# Patient Record
Sex: Male | Born: 1955 | Race: White | Hispanic: No | Marital: Married | Smoking: Never smoker
Health system: Southern US, Community
[De-identification: ages and names within clinical notes are randomized; demographics above are authoritative.]

## PROBLEM LIST (undated history)

## (undated) DIAGNOSIS — M1990 Unspecified osteoarthritis, unspecified site: Secondary | ICD-10-CM

## (undated) DIAGNOSIS — I872 Venous insufficiency (chronic) (peripheral): Secondary | ICD-10-CM

## (undated) DIAGNOSIS — E785 Hyperlipidemia, unspecified: Secondary | ICD-10-CM

## (undated) DIAGNOSIS — I77810 Thoracic aortic ectasia: Secondary | ICD-10-CM

## (undated) DIAGNOSIS — T7840XA Allergy, unspecified, initial encounter: Secondary | ICD-10-CM

## (undated) HISTORY — PX: TONSILLECTOMY: SHX5217

## (undated) HISTORY — DX: Unspecified osteoarthritis, unspecified site: M19.90

## (undated) HISTORY — DX: Thoracic aortic ectasia: I77.810

## (undated) HISTORY — DX: Hyperlipidemia, unspecified: E78.5

## (undated) HISTORY — DX: Allergy, unspecified, initial encounter: T78.40XA

## (undated) HISTORY — PX: TONSILLECTOMY: SUR1361

## (undated) HISTORY — DX: Venous insufficiency (chronic) (peripheral): I87.2

---

## 2010-08-04 ENCOUNTER — Ambulatory Visit: Payer: Self-pay | Admitting: Gastroenterology

## 2010-08-04 LAB — HM COLONOSCOPY

## 2011-07-20 LAB — PSA

## 2012-02-07 ENCOUNTER — Ambulatory Visit: Payer: Self-pay | Admitting: Family Medicine

## 2012-02-16 ENCOUNTER — Ambulatory Visit: Payer: Self-pay | Admitting: Internal Medicine

## 2012-05-14 ENCOUNTER — Ambulatory Visit: Payer: Self-pay | Admitting: Internal Medicine

## 2012-08-16 ENCOUNTER — Ambulatory Visit: Payer: Self-pay | Admitting: Internal Medicine

## 2012-12-24 ENCOUNTER — Ambulatory Visit: Payer: Self-pay

## 2014-05-29 ENCOUNTER — Ambulatory Visit: Payer: Self-pay | Admitting: Emergency Medicine

## 2014-07-11 ENCOUNTER — Ambulatory Visit: Payer: Self-pay

## 2015-02-27 ENCOUNTER — Ambulatory Visit
Admit: 2015-02-27 | Disposition: A | Discharge status: Home / Self Care | Payer: Self-pay | Attending: Unknown Physician Specialty | Admitting: Unknown Physician Specialty

## 2015-06-03 ENCOUNTER — Ambulatory Visit
Admission: EM | Admit: 2015-06-03 | Discharge: 2015-06-03 | Disposition: A | Discharge status: Home / Self Care | Payer: No Typology Code available for payment source | Attending: Internal Medicine | Admitting: Internal Medicine

## 2015-06-03 ENCOUNTER — Encounter: Payer: Self-pay | Admitting: Emergency Medicine

## 2015-06-03 DIAGNOSIS — J018 Other acute sinusitis: Secondary | ICD-10-CM

## 2015-06-03 MED ORDER — DOXYCYCLINE HYCLATE 100 MG PO CAPS: 100.0000 mg | ORAL_CAPSULE | Freq: Two times a day (BID) | ORAL | Status: DC

## 2015-06-03 MED ORDER — PREDNISONE 50 MG PO TABS: 50.0000 mg | ORAL_TABLET | Freq: Every day | ORAL | Status: DC

## 2015-06-03 NOTE — ED Notes (Signed)
Patient c/o sinus pain and pressure, nasal congestion, and HAs for the past 3 days.  Patient denies fevers.

## 2015-06-03 NOTE — ED Provider Notes (Signed)
CSN: [id number]     Arrival date & time 06/03/15  0759 History   First MD Initiated Contact with Patient 06/03/15 0901     Chief Complaint  Patient presents with  . Facial Pain  . Nasal Congestion   HPI Patient is a 59 year old gentleman with minimal past medical history. He presents today with a several days history of head congestion, runny nose, cough that is occasionally productive. Tactile temps. Low bit of achiness. He denies nausea, vomiting, diarrhea. He is taking Mucinex over-the-counter, but not improving.  History reviewed. No pertinent past medical history. Past Surgical History  Procedure Laterality Date  . Tonsillectomy     History reviewed. No pertinent family history. History  Substance Use Topics  . Smoking status: Never Smoker   . Smokeless tobacco: Never Used  . Alcohol Use: No    Review of Systems  All other systems reviewed and are negative.   Allergies  Review of patient's allergies indicates no known allergies.  Home Medications    BP 112/86 mmHg  Pulse 79  Temp(Src) 97.4 F (36.3 C) (Tympanic)  Resp 16  Ht 5\' 6"  (1.676 m)  Wt 160 lb (72.576 kg)  BMI 25.84 kg/m2  SpO2 98% Physical Exam  Constitutional: He is oriented to person, place, and time. No distress.  Alert, nicely groomed Voice sounds quite congested  HENT:  Head: Atraumatic.  Bilateral TMs are mildly dull, no erythema Moderate nasal congestion, with mucopurulent material present Throat is red with postnasal drainage evident.  Eyes:  Conjugate gaze, no eye redness/drainage  Neck: Neck supple.  Cardiovascular: Normal rate and regular rhythm.   Pulmonary/Chest: No respiratory distress. He has no wheezes. He has no rales.  Lungs clear, symmetric breath sounds  Abdominal: Soft. He exhibits no distension.  Musculoskeletal: Normal range of motion.  Neurological: He is alert and oriented to person, place, and time.  Skin: Skin is warm and dry.  No cyanosis  Nursing note and vitals  reviewed.   ED Course  Procedures  No procedures at the urgent care today  MDM   1. Other acute sinusitis    Prescription for prednisone burst, with doxycycline to start in a few days if symptoms are continuing. Follow-up with PCP, Gabriel Cirriheryl Wicker NP for persistent or worsening symptoms.    Eustace MooreLaura W Mihira Tozzi, MD 06/03/15 [phone]

## 2015-06-03 NOTE — Discharge Instructions (Signed)
Prescriptions for prednisone and doxycycline were sent to the pharmacy.  Start the prednisone first, to see if decongestion improves your symptoms enough first, then if not can start the doxycycline if not improving after a week of symptoms. Recheck with pcp/Cheryl Wicker NP for persistent/worsening symptoms. Anticipate slow improvement over the next couple weeks; cough usually takes the longest to resolve.  Upper Respiratory Infection, Adult An upper respiratory infection (URI) is also sometimes known as the common cold. The upper respiratory tract includes the nose, sinuses, throat, trachea, and bronchi. Bronchi are the airways leading to the lungs. Most people improve within 1 week, but symptoms can last up to 2 weeks. A residual cough may last even longer.  CAUSES Many different viruses can infect the tissues lining the upper respiratory tract. The tissues become irritated and inflamed and often become very moist. Mucus production is also common. A cold is contagious. You can easily spread the virus to others by oral contact. This includes kissing, sharing a glass, coughing, or sneezing. Touching your mouth or nose and then touching a surface, which is then touched by another person, can also spread the virus. SYMPTOMS  Symptoms typically develop 1 to 3 days after you come in contact with a cold virus. Symptoms vary from person to person. They may include:  Runny nose.  Sneezing.  Nasal congestion.  Sinus irritation.  Sore throat.  Loss of voice (laryngitis).  Cough.  Fatigue.  Muscle aches.  Loss of appetite.  Headache.  Low-grade fever. DIAGNOSIS  You might diagnose your own cold based on familiar symptoms, since most people get a cold 2 to 3 times a year. Your caregiver can confirm this based on your exam. Most importantly, your caregiver can check that your symptoms are not due to another disease such as strep throat, sinusitis, pneumonia, asthma, or epiglottitis. Blood  tests, throat tests, and X-rays are not necessary to diagnose a common cold, but they may sometimes be helpful in excluding other more serious diseases. Your caregiver will decide if any further tests are required. RISKS AND COMPLICATIONS  You may be at risk for a more severe case of the common cold if you smoke cigarettes, have chronic heart disease (such as heart failure) or lung disease (such as asthma), or if you have a weakened immune system. The very young and very old are also at risk for more serious infections. Bacterial sinusitis, middle ear infections, and bacterial pneumonia can complicate the common cold. The common cold can worsen asthma and chronic obstructive pulmonary disease (COPD). Sometimes, these complications can require emergency medical care and may be life-threatening. PREVENTION  The best way to protect against getting a cold is to practice good hygiene. Avoid oral or hand contact with people with cold symptoms. Wash your hands often if contact occurs. There is no clear evidence that vitamin C, vitamin E, echinacea, or exercise reduces the chance of developing a cold. However, it is always recommended to get plenty of rest and practice good nutrition. TREATMENT  Treatment is directed at relieving symptoms. There is no cure. Antibiotics are not effective, because the infection is caused by a virus, not by bacteria. Treatment may include:  Increased fluid intake. Sports drinks offer valuable electrolytes, sugars, and fluids.  Breathing heated mist or steam (vaporizer or shower).  Eating chicken soup or other clear broths, and maintaining good nutrition.  Getting plenty of rest.  Using gargles or lozenges for comfort.  Controlling fevers with ibuprofen or acetaminophen as directed by  your caregiver.  Increasing usage of your inhaler if you have asthma. Zinc gel and zinc lozenges, taken in the first 24 hours of the common cold, can shorten the duration and lessen the  severity of symptoms. Pain medicines may help with fever, muscle aches, and throat pain. A variety of non-prescription medicines are available to treat congestion and runny nose. Your caregiver can make recommendations and may suggest nasal or lung inhalers for other symptoms.  HOME CARE INSTRUCTIONS   Only take over-the-counter or prescription medicines for pain, discomfort, or fever as directed by your caregiver.  Use a warm mist humidifier or inhale steam from a shower to increase air moisture. This may keep secretions moist and make it easier to breathe.  Drink enough water and fluids to keep your urine clear or pale yellow.  Rest as needed.  Return to work when your temperature has returned to normal or as your caregiver advises. You may need to stay home longer to avoid infecting others. You can also use a face mask and careful hand washing to prevent spread of the virus. SEEK MEDICAL CARE IF:   After the first few days, you feel you are getting worse rather than better.  You need your caregiver's advice about medicines to control symptoms.  You develop chills, worsening shortness of breath, or brown or red sputum. These may be signs of pneumonia.  You develop yellow or brown nasal discharge or pain in the face, especially when you bend forward. These may be signs of sinusitis.  You develop a fever, swollen neck glands, pain with swallowing, or white areas in the back of your throat. These may be signs of strep throat. SEEK IMMEDIATE MEDICAL CARE IF:   You have a fever.  You develop severe or persistent headache, ear pain, sinus pain, or chest pain.  You develop wheezing, a prolonged cough, cough up blood, or have a change in your usual mucus (if you have chronic lung disease).  You develop sore muscles or a stiff neck. Document Released: 05/05/2001 Document Revised: 02/01/2012 Document Reviewed: 02/14/2014 [facility] Patient Information 2015 [place], [place]. This information is  not intended to replace advice given to you by your health care provider. Make sure you discuss any questions you have with your health care provider.

## 2015-10-18 DIAGNOSIS — L4050 Arthropathic psoriasis, unspecified: Secondary | ICD-10-CM

## 2015-10-18 DIAGNOSIS — I872 Venous insufficiency (chronic) (peripheral): Secondary | ICD-10-CM

## 2015-10-18 DIAGNOSIS — E785 Hyperlipidemia, unspecified: Secondary | ICD-10-CM | POA: Insufficient documentation

## 2015-10-18 DIAGNOSIS — J45909 Unspecified asthma, uncomplicated: Secondary | ICD-10-CM | POA: Insufficient documentation

## 2015-10-18 DIAGNOSIS — J309 Allergic rhinitis, unspecified: Secondary | ICD-10-CM

## 2015-10-30 ENCOUNTER — Encounter: Payer: Self-pay | Admitting: Unknown Physician Specialty

## 2015-10-30 ENCOUNTER — Ambulatory Visit (INDEPENDENT_AMBULATORY_CARE_PROVIDER_SITE_OTHER): Payer: No Typology Code available for payment source | Admitting: Unknown Physician Specialty

## 2015-10-30 VITALS — BP 125/85 | HR 70 | Temp 98.1°F | Ht 63.3 in | Wt 161.2 lb

## 2015-10-30 DIAGNOSIS — Z23 Encounter for immunization: Secondary | ICD-10-CM | POA: Diagnosis not present

## 2015-10-30 DIAGNOSIS — Z0000 Encounter for general adult medical examination without abnormal findings: Secondary | ICD-10-CM

## 2015-10-30 NOTE — Progress Notes (Signed)
   BP 125/85 mmHg  Pulse 70  Temp(Src) 98.1 F (36.7 C)  Ht 5' 3.3" (1.608 m)  Wt 161 lb 3.2 oz (73.12 kg)  BMI 28.28 kg/m2  SpO2 97%   Subjective:    Patient ID: Benjamin Boone, male    DOB: 12/25/1955, 59 y.o.   MRN: [id number]  HPI: Benjamin Boone is a 59 y.o. male  Chief Complaint  Patient presents with  . Annual Exam    Relevant past medical, surgical, family and social history reviewed and updated as indicated. Interim medical history since our last visit reviewed. Allergies and medications reviewed and updated.  Review of Systems  Constitutional: Negative.   HENT: Negative.   Eyes: Negative.   Respiratory: Negative.   Cardiovascular: Negative.   Gastrointestinal: Negative.   Endocrine: Negative.   Genitourinary: Negative.   Skin: Negative.   Allergic/Immunologic: Negative.   Neurological: Negative.   Hematological: Negative.   Psychiatric/Behavioral: Negative.     Per HPI unless specifically indicated above     Objective:    BP 125/85 mmHg  Pulse 70  Temp(Src) 98.1 F (36.7 C)  Ht 5' 3.3" (1.608 m)  Wt 161 lb 3.2 oz (73.12 kg)  BMI 28.28 kg/m2  SpO2 97%  Wt Readings from Last 3 Encounters:  10/30/15 161 lb 3.2 oz (73.12 kg)  02/27/15 168 lb (76.204 kg)  06/03/15 160 lb (72.576 kg)    Physical Exam  Constitutional: He is oriented to person, place, and time. He appears well-developed and well-nourished.  HENT:  Head: Normocephalic.  Eyes: Pupils are equal, round, and reactive to light.  Cardiovascular: Normal rate, regular rhythm and normal heart sounds.   Pulmonary/Chest: Effort normal.  Abdominal: Soft. Bowel sounds are normal.  Musculoskeletal: Normal range of motion.  Neurological: He is alert and oriented to person, place, and time. He has normal reflexes.  Skin: Skin is warm and dry.  Psychiatric: He has a normal mood and affect. His behavior is normal. Judgment and thought content normal.    Assessment & Plan:    Problem List Items Addressed This Visit    None    Visit Diagnoses    Immunization due    -  Primary    Relevant Orders    Flu Vaccine QUAD 36+ mos IM (Completed)    Routine general medical examination at a health care facility        Relevant Orders    CBC with Differential/Platelet    Comprehensive metabolic panel    PSA    TSH    Hepatitis C antibody    HIV antibody    Lipid Panel w/o Chol/HDL Ratio        Follow up plan: Return in about 1 year (around 10/29/2016).

## 2015-10-31 LAB — CBC WITH DIFFERENTIAL/PLATELET
Basophils Absolute: 0.1 10*3/uL (ref 0.0–0.2)
Basos: 1 %
EOS (ABSOLUTE): 0.5 10*3/uL — ABNORMAL HIGH (ref 0.0–0.4)
EOS: 7 %
HEMATOCRIT: 44.8 % (ref 37.5–51.0)
HEMOGLOBIN: 15.5 g/dL (ref 12.6–17.7)
Immature Grans (Abs): 0 10*3/uL (ref 0.0–0.1)
Immature Granulocytes: 0 %
Lymphocytes Absolute: 2.3 10*3/uL (ref 0.7–3.1)
Lymphs: 32 %
MCH: 28.7 pg (ref 26.6–33.0)
MCHC: 34.6 g/dL (ref 31.5–35.7)
MCV: 83 fL (ref 79–97)
MONOCYTES: 7 %
Monocytes Absolute: 0.5 10*3/uL (ref 0.1–0.9)
Neutrophils Absolute: 3.7 10*3/uL (ref 1.4–7.0)
Neutrophils: 53 %
Platelets: 288 10*3/uL (ref 150–379)
RBC: 5.41 x10E6/uL (ref 4.14–5.80)
RDW: 13.4 % (ref 12.3–15.4)
WBC: 7 10*3/uL (ref 3.4–10.8)

## 2015-10-31 LAB — COMPREHENSIVE METABOLIC PANEL
A/G RATIO: 1.5 (ref 1.1–2.5)
ALBUMIN: 4.1 g/dL (ref 3.5–5.5)
ALK PHOS: 89 IU/L (ref 39–117)
ALT: 21 IU/L (ref 0–44)
AST: 22 IU/L (ref 0–40)
BUN / CREAT RATIO: 12 (ref 9–20)
BUN: 13 mg/dL (ref 6–24)
Bilirubin Total: 0.3 mg/dL (ref 0.0–1.2)
CO2: 24 mmol/L (ref 18–29)
CREATININE: 1.09 mg/dL (ref 0.76–1.27)
Calcium: 9.8 mg/dL (ref 8.7–10.2)
Chloride: 99 mmol/L (ref 97–106)
GFR calc Af Amer: 87 mL/min/{1.73_m2} (ref >59)
GFR calc non Af Amer: 75 mL/min/{1.73_m2} (ref >59)
GLOBULIN, TOTAL: 2.7 g/dL (ref 1.5–4.5)
Glucose: 85 mg/dL (ref 65–99)
POTASSIUM: 4.5 mmol/L (ref 3.5–5.2)
SODIUM: 137 mmol/L (ref 136–144)
Total Protein: 6.8 g/dL (ref 6.0–8.5)

## 2015-10-31 LAB — HEPATITIS C ANTIBODY: Hep C Virus Ab: <0.1 s/co ratio (ref 0.0–0.9)

## 2015-10-31 LAB — PSA: Prostate Specific Ag, Serum: 0.9 ng/mL (ref 0.0–4.0)

## 2015-10-31 LAB — TSH: TSH: 3.91 u[IU]/mL (ref 0.450–4.500)

## 2015-10-31 LAB — LIPID PANEL W/O CHOL/HDL RATIO
Cholesterol, Total: 229 mg/dL — ABNORMAL HIGH (ref 100–199)
HDL: 35 mg/dL — ABNORMAL LOW (ref >39)
LDL Calculated: 118 mg/dL — ABNORMAL HIGH (ref 0–99)
TRIGLYCERIDES: 381 mg/dL — AB (ref 0–149)
VLDL CHOLESTEROL CAL: 76 mg/dL — AB (ref 5–40)

## 2015-10-31 LAB — HIV ANTIBODY (ROUTINE TESTING W REFLEX): HIV Screen 4th Generation wRfx: NONREACTIVE

## 2015-11-01 ENCOUNTER — Encounter: Payer: Self-pay | Admitting: Unknown Physician Specialty

## 2015-12-04 ENCOUNTER — Encounter: Payer: Self-pay | Admitting: Emergency Medicine

## 2015-12-04 ENCOUNTER — Ambulatory Visit
Admission: EM | Admit: 2015-12-04 | Discharge: 2015-12-04 | Disposition: A | Discharge status: Home / Self Care | Payer: BLUE CROSS/BLUE SHIELD | Attending: Emergency Medicine | Admitting: Emergency Medicine

## 2015-12-04 DIAGNOSIS — J0100 Acute maxillary sinusitis, unspecified: Secondary | ICD-10-CM

## 2015-12-04 MED ORDER — AMOXICILLIN-POT CLAVULANATE 875-125 MG PO TABS: 1.0000 | ORAL_TABLET | Freq: Two times a day (BID) | ORAL | Status: DC

## 2015-12-04 NOTE — ED Notes (Signed)
Patient states he "has recurrent sinus symptoms which have been going on for 2 weeks"

## 2015-12-04 NOTE — Discharge Instructions (Signed)

## 2015-12-04 NOTE — ED Provider Notes (Signed)
[facility]  ____________________________________________  Time seen: Approximately 8:42 AM  I have reviewed the triage vital signs and the nursing notes.   HISTORY  Chief Complaint Recurrent Sinusitis   HPI Benjamin Boone is a 60 y.o. malepresents for complaints of 2 weeks of runny nose, nasal congestion and sinus pressure. Patient reports he is feeling getting thick greenish yellowish nasal drainage and blowing his nose. States really have and sinus pressure around his cheeks. States sinus pressure is a 3-4 out of 10 aching discomfort. Reports continues to eat and drink well. Denies fevers. Reports frequent exposed to sick contacts.    patient reports history of sinus infections at least once or twice a year. States has been following with ENT in the past. Denies recent antibiotics. Denies chest pain, shortness of breath, abdominal pain, fevers, weakness, dizziness, vision changes or other complaints.   PCP: Jamesetta Orleans  Past Medical History  Diagnosis Date  . Arthritis   . Allergy   . Hyperlipidemia   . Venous insufficiency     Patient Active Problem List   Diagnosis Date Noted  . Psoriatic arthritis (HCC) 10/18/2015  . Allergic rhinitis 10/18/2015  . Hyperlipidemia 10/18/2015  . Venous insufficiency 10/18/2015  . Asthma 10/18/2015    Past Surgical History  Procedure Laterality Date  . Tonsillectomy    . Tonsillectomy      Current Outpatient Rx  Name  Route  Sig  Dispense  Refill  . aspirin 81 MG tablet   Oral   Take 81 mg by mouth daily.         .           . fluticasone (FLONASE) 50 MCG/ACT nasal spray   Each Nare   Place 2 sprays into both nostrils daily.           Allergies Doxycycline  Family History  Problem Relation Age of Onset  . Heart disease Father     MI  . Heart disease Mother     pacemaker    Social History Social History  Substance Use Topics  . Smoking status: Never Smoker   . Smokeless tobacco: Never Used  .  Alcohol Use: No    Review of Systems Constitutional: No fever/chills Eyes: No visual changes. ENT: No sore throat. Positive runny nose, nasal congestion, sinus pressure and sinus drainage. States occasional cough at night.  Cardiovascular: Denies chest pain. Respiratory: Denies shortness of breath. Gastrointestinal: No abdominal pain.  No nausea, no vomiting.  No diarrhea.  No constipation. Genitourinary: Negative for dysuria. Musculoskeletal: Negative for back pain. Skin: Negative for rash. Neurological: Negative for headaches, focal weakness or numbness.  10-point ROS otherwise negative.  ____________________________________________   PHYSICAL EXAM:  VITAL SIGNS: ED Triage Vitals  Enc Vitals Group     BP 12/04/15 0829 100/72 mmHg     Pulse Rate 12/04/15 0829 73     Resp 12/04/15 0829 16     Temp 12/04/15 0829 97.5 F (36.4 C)     Temp Source 12/04/15 0829 Oral     SpO2 12/04/15 0829 98 %     Weight 12/04/15 0829 162 lb (73.483 kg)     Height 12/04/15 0829 5\' 5"  (1.651 m)     Head Cir --      Peak Flow --      Pain Score 12/04/15 0829 3     Pain Loc --      Pain Edu? --      Excl. in GC? --  Constitutional: Alert and oriented. Well appearing and in no acute distress. Eyes: Conjunctivae are normal. PERRL. EOMI. Head: [email or web address] to mod TTP bilateral  maxillary sinuses, mild tenderness palpation bilateral frontal sinuses. No swelling. No erythema.   Ears: no erythema, normal TMs bilaterally.   NoseNasal congestion with bilateral nasal turbinate erythema and edema. Mouth/Throat: Mucous membranes are moist.  Oropharynx non-erythematous. no tonsillar swelling or exudate.  Neck: No stridor.  No cervical spine tenderness to palpation. Hematological/Lymphatic/Immunilogical: No cervical lymphadenopathy. Cardiovascular: Normal rate, regular rhythm. Grossly normal heart sounds.  Good peripheral circulation. Respiratory: Normal respiratory effort.  No retractions. Lungs  CTAB. Gastrointestinal: Soft and nontender. Musculoskeletal: No lower or upper extremity tenderness nor edema.   Neurologic:  Normal speech and language. No gross focal neurologic deficits are appreciated. No gait instability. Skin:  Skin is warm, dry and intact. No rash noted. Psychiatric: Mood and affect are normal. Speech and behavior are normal.  ____________________________________________   LABS (all labs ordered are listed, but only abnormal results are displayed)  Labs Reviewed - No data to display ____________________________________________   INITIAL IMPRESSION / ASSESSMENT AND PLAN / ED COURSE  Pertinent labs & imaging results that were available during my care of the patient were reviewed by me and considered in my medical decision making (see chart for details).   well-appearing patient. No acute distress. Presents for the complaints of 2 weeks of runny nose, nasal congestion, sinus pressure and sinus drainage. Reports this has been unrelieved with multiple over-the-counter cough and congestion medications. Lungs clear throughout. Well-appearing patient. Will treat sinusitis with oral Augmentin and encouraged rest, saline sinus rinses, fluids and PCP follow-up.  Discussed follow up with Primary care physician this week. Discussed follow up and return parameters including no resolution or any worsening concerns. Patient verbalized understanding and agreed to plan.   ____________________________________________   FINAL CLINICAL IMPRESSION(S) / ED DIAGNOSES  Final diagnoses:  Acute maxillary sinusitis, recurrence not specified       Renford DillsLindsey Ankush Gintz, NP 12/04/15 0848

## 2016-04-02 ENCOUNTER — Other Ambulatory Visit: Payer: Self-pay

## 2016-04-03 ENCOUNTER — Encounter: Payer: Self-pay | Admitting: Unknown Physician Specialty

## 2016-04-03 ENCOUNTER — Ambulatory Visit (INDEPENDENT_AMBULATORY_CARE_PROVIDER_SITE_OTHER): Payer: BLUE CROSS/BLUE SHIELD | Admitting: Unknown Physician Specialty

## 2016-04-03 VITALS — BP 125/91 | HR 80 | Temp 97.8°F | Ht 64.7 in | Wt 164.8 lb

## 2016-04-03 DIAGNOSIS — J019 Acute sinusitis, unspecified: Secondary | ICD-10-CM | POA: Diagnosis not present

## 2016-04-03 MED ORDER — AMOXICILLIN-POT CLAVULANATE 875-125 MG PO TABS: 1.0000 | ORAL_TABLET | Freq: Two times a day (BID) | ORAL | Status: DC

## 2016-04-03 NOTE — Patient Instructions (Signed)
Sinusitis, Adult °Sinusitis is redness, soreness, and inflammation of the paranasal sinuses. Paranasal sinuses are air pockets within the bones of your face. They are located beneath your eyes, in the middle of your forehead, and above your eyes. In healthy paranasal sinuses, mucus is able to drain out, and air is able to circulate through them by way of your nose. However, when your paranasal sinuses are inflamed, mucus and air can become trapped. This can allow bacteria and other germs to grow and cause infection. °Sinusitis can develop quickly and last only a short time (acute) or continue over a long period (chronic). Sinusitis that lasts for more than 12 weeks is considered chronic. °CAUSES °Causes of sinusitis include: °· Allergies. °· Structural abnormalities, such as displacement of the cartilage that separates your nostrils (deviated septum), which can decrease the air flow through your nose and sinuses and affect sinus drainage. °· Functional abnormalities, such as when the small hairs (cilia) that line your sinuses and help remove mucus do not work properly or are not present. °SIGNS AND SYMPTOMS °Symptoms of acute and chronic sinusitis are the same. The primary symptoms are pain and pressure around the affected sinuses. Other symptoms include: °· Upper toothache. °· Earache. °· Headache. °· Bad breath. °· Decreased sense of smell and taste. °· A cough, which worsens when you are lying flat. °· Fatigue. °· Fever. °· Thick drainage from your nose, which often is green and may contain pus (purulent). °· Swelling and warmth over the affected sinuses. °DIAGNOSIS °Your health care provider will perform a physical exam. During your exam, your health care provider may perform any of the following to help determine if you have acute sinusitis or chronic sinusitis: °· Look in your nose for signs of abnormal growths in your nostrils (nasal polyps). °· Tap over the affected sinus to check for signs of  infection. °· View the inside of your sinuses using an imaging device that has a light attached (endoscope). °If your health care provider suspects that you have chronic sinusitis, one or more of the following tests may be recommended: °· Allergy tests. °· Nasal culture. A sample of mucus is taken from your nose, sent to a lab, and screened for bacteria. °· Nasal cytology. A sample of mucus is taken from your nose and examined by your health care provider to determine if your sinusitis is related to an allergy. °TREATMENT °Most cases of acute sinusitis are related to a viral infection and will resolve on their own within 10 days. Sometimes, medicines are prescribed to help relieve symptoms of both acute and chronic sinusitis. These may include pain medicines, decongestants, nasal steroid sprays, or saline sprays. °However, for sinusitis related to a bacterial infection, your health care provider will prescribe antibiotic medicines. These are medicines that will help kill the bacteria causing the infection. °Rarely, sinusitis is caused by a fungal infection. In these cases, your health care provider will prescribe antifungal medicine. °For some cases of chronic sinusitis, surgery is needed. Generally, these are cases in which sinusitis recurs more than 3 times per year, despite other treatments. °HOME CARE INSTRUCTIONS °· Drink plenty of water. Water helps thin the mucus so your sinuses can drain more easily. °· Use a humidifier. °· Inhale steam 3-4 times a day (for example, sit in the bathroom with the shower running). °· Apply a warm, moist washcloth to your face 3-4 times a day, or as directed by your health care provider. °· Use saline nasal sprays to help   moisten and clean your sinuses. °· Take medicines only as directed by your health care provider. °· If you were prescribed either an antibiotic or antifungal medicine, finish it all even if you start to feel better. °SEEK IMMEDIATE MEDICAL CARE IF: °· You have  increasing pain or severe headaches. °· You have nausea, vomiting, or drowsiness. °· You have swelling around your face. °· You have vision problems. °· You have a stiff neck. °· You have difficulty breathing. °  °This information is not intended to replace advice given to you by your health care provider. Make sure you discuss any questions you have with your health care provider. °  °Document Released: 11/09/2005 Document Revised: 11/30/2014 Document Reviewed: 11/24/2011 °Elsevier Interactive Patient Education ©2016 [employer] ° °Sinus Rinse °WHAT IS A SINUS RINSE? °A sinus rinse is a home treatment. It rinses your sinuses with a mixture of salt and water (saline solution). Sinuses are air-filled spaces in your skull behind the bones of your face and forehead. They open into your nasal cavity. °To do a sinus rinse, you will need: °· Saline solution. °· Neti pot or spray bottle. This releases the saline solution into your nose and through your sinuses. You can buy neti pots and spray bottles at: °¨ Your local pharmacy. °¨ A health food store. °¨ Online. °WHEN WOULD I DO A SINUS RINSE?  °A sinus rinse can help to clear your nasal cavity. It can clear:  °· Mucus. °· Dirt. °· Dust. °· Pollen. °You may do a sinus rinse when you have: °· A cold. °· A virus. °· Allergies. °· A sinus infection. °· A stuffy nose. °If you are considering a sinus rinse: °· Ask your child's doctor before doing a sinus rinse on your child. °· Do not do a sinus rinse if you have had: °¨ Ear or nasal surgery. °¨ An ear infection. °¨ Blocked ears. °HOW DO I DO A SINUS RINSE?  °· Wash your hands. °· Disinfect your device using the directions that came with the device. °· Dry your device. °· Use the solution that comes with your device or one that is sold separately in stores. Follow the mixing directions on the package. °· Fill your device with the amount of saline solution as stated in the device instructions. °· Stand over a sink and tilt  your head sideways over the sink. °· Place the spout of the device in your upper nostril (the one closer to the ceiling). °· Gently pour or squeeze the saline solution into the nasal cavity. The liquid should drain to the lower nostril if you are not too congested. °· Gently blow your nose. Blowing too hard may cause ear pain. °· Repeat in the other nostril. °· Clean and rinse your device with clean water. °· Air-dry your device. °ARE THERE RISKS OF A SINUS RINSE?  °Sinus rinse is normally very safe and helpful. However, there are a few risks, which include:  °· A burning feeling in the sinuses. This may happen if you do not make the saline solution as instructed. Make sure to follow all directions when making the saline solution. °· Infection from unclean water. This is rare, but possible. °· Nasal irritation. °  °This information is not intended to replace advice given to you by your health care provider. Make sure you discuss any questions you have with your health care provider. °  °Document Released: 06/06/2014 Document Reviewed: 06/06/2014 °Elsevier Interactive Patient Education ©2016 [employer] ° °

## 2016-04-03 NOTE — Progress Notes (Signed)
---------------------------------------------------------------------------------------------------------------------------------------------------------------------------------------------------------------------------------------------------  BP 125/91 mmHg  Pulse 80  Temp(Src) 97.8 F (36.6 C)  Ht 5' 4.7" (1.643 m)  Wt 164 lb 12.8 oz (74.753 kg)  BMI 27.69 kg/m2  SpO2 96%   Subjective:    Patient ID: Benjamin Boone, male    DOB: January 10, 1956, 60 y.o.   MRN: [id number]  HPI: Benjamin Boone is a 60 y.o. male  Chief Complaint  Patient presents with  . Sinusitis    pt states he has had some draingage and congestion for a while now   Sinusitis This is a new problem. Episode onset: 3-4 weeks. The problem has been gradually worsening since onset. There has been no fever. Associated symptoms include congestion, coughing, headaches, a hoarse voice and sinus pressure. Pertinent negatives include no shortness of breath or sore throat. Past treatments include nothing.     Relevant past medical, surgical, family and social history reviewed and updated as indicated. Interim medical history since our last visit reviewed. Allergies and medications reviewed and updated.  Review of Systems  HENT: Positive for congestion, hoarse voice and sinus pressure. Negative for sore throat.   Respiratory: Positive for cough. Negative for shortness of breath.   Neurological: Positive for headaches.    Per HPI unless specifically indicated above     Objective:    BP 125/91 mmHg  Pulse 80  Temp(Src) 97.8 F (36.6 C)  Ht 5' 4.7" (1.643 m)  Wt 164 lb 12.8 oz (74.753 kg)  BMI 27.69 kg/m2  SpO2 96%  Wt Readings from Last 3 Encounters:  04/03/16 164 lb 12.8 oz (74.753 kg)  12/04/15 162 lb (73.483 kg)  10/30/15 161 lb 3.2 oz (73.12 kg)    Physical Exam  Constitutional: He is oriented to person, place, and time. He appears well-developed and well-nourished. No distress.  HENT:   Head: Normocephalic and atraumatic.  Right Ear: Tympanic membrane and ear canal normal.  Left Ear: Tympanic membrane and ear canal normal.  Nose: Mucosal edema and sinus tenderness present. Right sinus exhibits maxillary sinus tenderness. Left sinus exhibits maxillary sinus tenderness.  Mouth/Throat: Mucous membranes are normal. Oropharyngeal exudate and posterior oropharyngeal edema present.  Eyes: Conjunctivae and lids are normal. Right eye exhibits no discharge. Left eye exhibits no discharge. No scleral icterus.  Cardiovascular: Normal rate and regular rhythm.   Pulmonary/Chest: Effort normal. No respiratory distress.  Abdominal: Normal appearance and bowel sounds are normal. He exhibits no distension. There is no splenomegaly or hepatomegaly. There is no tenderness.  Musculoskeletal: Normal range of motion.  Neurological: He is alert and oriented to person, place, and time.  Skin: Skin is intact. No rash noted. No pallor.  Psychiatric: He has a normal mood and affect. His behavior is normal. Judgment and thought content normal.    Results for orders placed or performed in visit on 10/30/15  CBC with Differential/Platelet  Result Value Ref Range   WBC 7.0 3.4 - 10.8 x10E3/uL   RBC 5.41 4.14 - 5.80 x10E6/uL   Hemoglobin 15.5 12.6 - 17.7 g/dL   Hematocrit 40.9 81.1 - 51.0 %   MCV 83 79 - 97 fL   MCH 28.7 26.6 - 33.0 pg   MCHC 34.6 31.5 - 35.7 g/dL   RDW 91.4 78.2 - 95.6 %   Platelets 288 150 - 379 x10E3/uL   Neutrophils 53 %   Lymphs 32 %   Monocytes 7 %   Eos 7 %   Basos 1 %   Neutrophils Absolute 3.7 1.4 -  7.0 x10E3/uL   Lymphocytes Absolute 2.3 0.7 - 3.1 x10E3/uL   Monocytes Absolute 0.5 0.1 - 0.9 x10E3/uL   EOS (ABSOLUTE) 0.5 (H) 0.0 - 0.4 x10E3/uL   Basophils Absolute 0.1 0.0 - 0.2 x10E3/uL   Immature Granulocytes 0 %   Immature Grans (Abs) 0.0 0.0 - 0.1 x10E3/uL  Comprehensive metabolic panel  Result Value Ref Range   Glucose 85 65 - 99 mg/dL   BUN 13 6 - 24 mg/dL    Creatinine, Ser 1.611.09 0.76 - 1.27 mg/dL   GFR calc non Af Amer 75 >59 mL/min/1.73   GFR calc Af Amer 87 >59 mL/min/1.73   BUN/Creatinine Ratio 12 9 - 20   Sodium 137 136 - 144 mmol/L   Potassium 4.5 3.5 - 5.2 mmol/L   Chloride 99 97 - 106 mmol/L   CO2 24 18 - 29 mmol/L   Calcium 9.8 8.7 - 10.2 mg/dL   Total Protein 6.8 6.0 - 8.5 g/dL   Albumin 4.1 3.5 - 5.5 g/dL   Globulin, Total 2.7 1.5 - 4.5 g/dL   Albumin/Globulin Ratio 1.5 1.1 - 2.5   Bilirubin Total 0.3 0.0 - 1.2 mg/dL   Alkaline Phosphatase 89 39 - 117 IU/L   AST 22 0 - 40 IU/L   ALT 21 0 - 44 IU/L  PSA  Result Value Ref Range   Prostate Specific Ag, Serum 0.9 0.0 - 4.0 ng/mL  TSH  Result Value Ref Range   TSH 3.910 0.450 - 4.500 uIU/mL  Hepatitis C antibody  Result Value Ref Range   Hep C Virus Ab <0.1 0.0 - 0.9 s/co ratio  HIV antibody  Result Value Ref Range   HIV Screen 4th Generation wRfx Non Reactive Non Reactive  Lipid Panel w/o Chol/HDL Ratio  Result Value Ref Range   Cholesterol, Total 229 (H) 100 - 199 mg/dL   Triglycerides 096381 (H) 0 - 149 mg/dL   HDL 35 (L) >04>39 mg/dL   VLDL Cholesterol Cal 76 (H) 5 - 40 mg/dL   LDL Calculated 540118 (H) 0 - 99 mg/dL      Assessment & Plan:   Problem List Items Addressed This Visit    None    Visit Diagnoses    Acute sinusitis, recurrence not specified, unspecified location    -  Primary    Relevant Medications    cetirizine (ZYRTEC) 10 MG tablet    amoxicillin-clavulanate (AUGMENTIN) 875-125 MG tablet        Follow up plan: Return if symptoms worsen or fail to improve.

## 2016-04-13 ENCOUNTER — Telehealth: Payer: Self-pay | Admitting: Unknown Physician Specialty

## 2016-04-13 MED ORDER — AMOXICILLIN-POT CLAVULANATE 875-125 MG PO TABS: 1.0000 | ORAL_TABLET | Freq: Two times a day (BID) | ORAL | Status: DC

## 2016-04-13 NOTE — Telephone Encounter (Signed)
Done

## 2016-04-13 NOTE — Telephone Encounter (Signed)
Pt would like to have another round of antibiotics called in to [facility]

## 2016-04-13 NOTE — Telephone Encounter (Signed)
Called and let patient know more medication was sent in.

## 2016-04-13 NOTE — Telephone Encounter (Signed)
Routing to provider. Patient was here 04/03/16.

## 2016-04-27 ENCOUNTER — Telehealth: Payer: Self-pay | Admitting: Unknown Physician Specialty

## 2016-04-27 MED ORDER — CEFDINIR 300 MG PO CAPS: 300.0000 mg | ORAL_CAPSULE | Freq: Two times a day (BID) | ORAL | Status: DC

## 2016-04-27 NOTE — Telephone Encounter (Signed)
Patient notified

## 2016-04-27 NOTE — Telephone Encounter (Signed)
Routing to provider  

## 2016-04-27 NOTE — Telephone Encounter (Signed)
Will call in something else

## 2016-04-27 NOTE — Telephone Encounter (Signed)
Pt stated that the antibiotic he was given didn't get rid of his sinus infection and he would like to know if he can have something else called in to [facility]

## 2016-05-07 IMAGING — CR DG KNEE COMPLETE 4+V*R*
4 series · 4 of 4 positions shown · non-contrast
Comparison: None.

CLINICAL DATA: Pain in right knee.

EXAM:
RIGHT KNEE - COMPLETE 4+ VIEW

[knee ap]
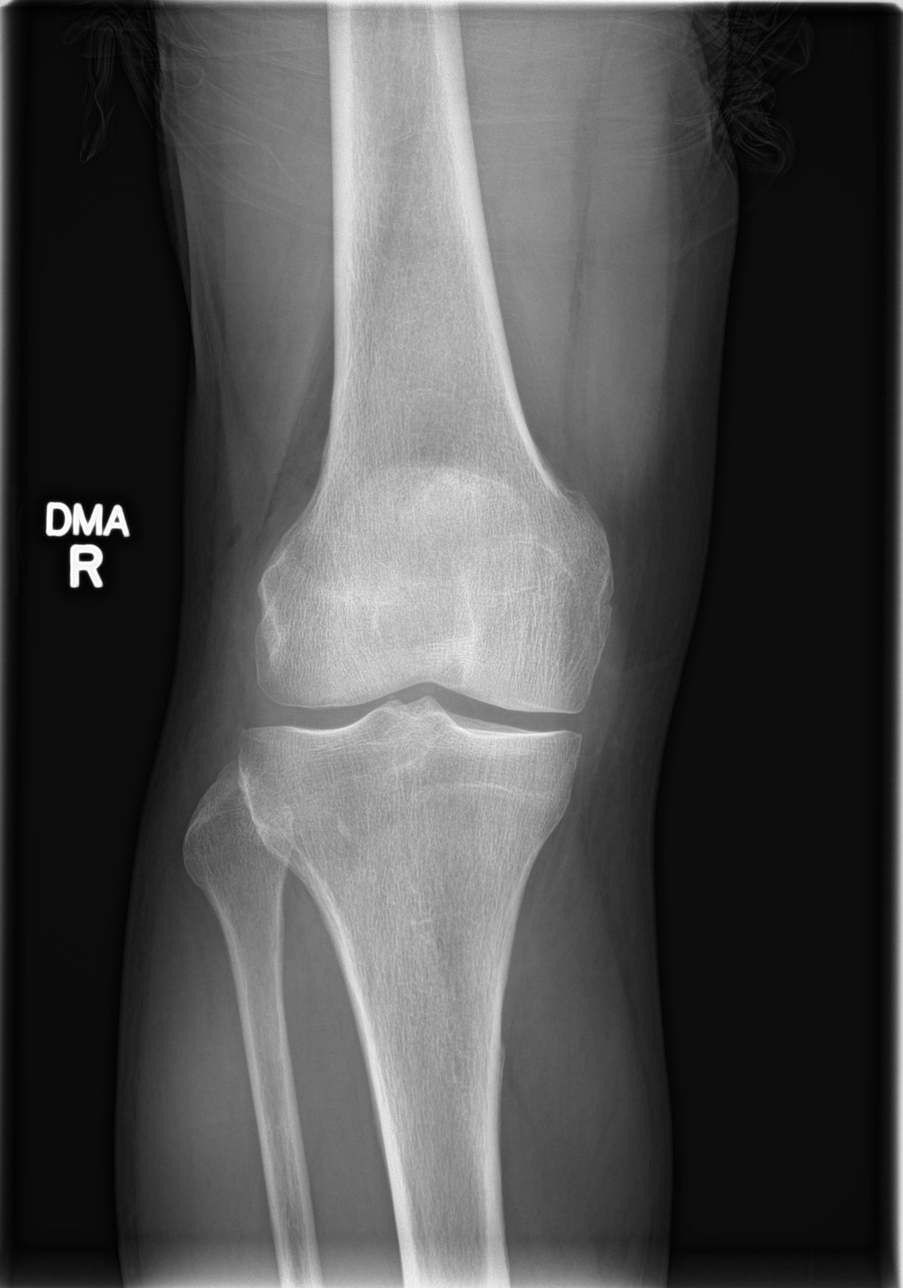

[knee obl (1 of 2)]
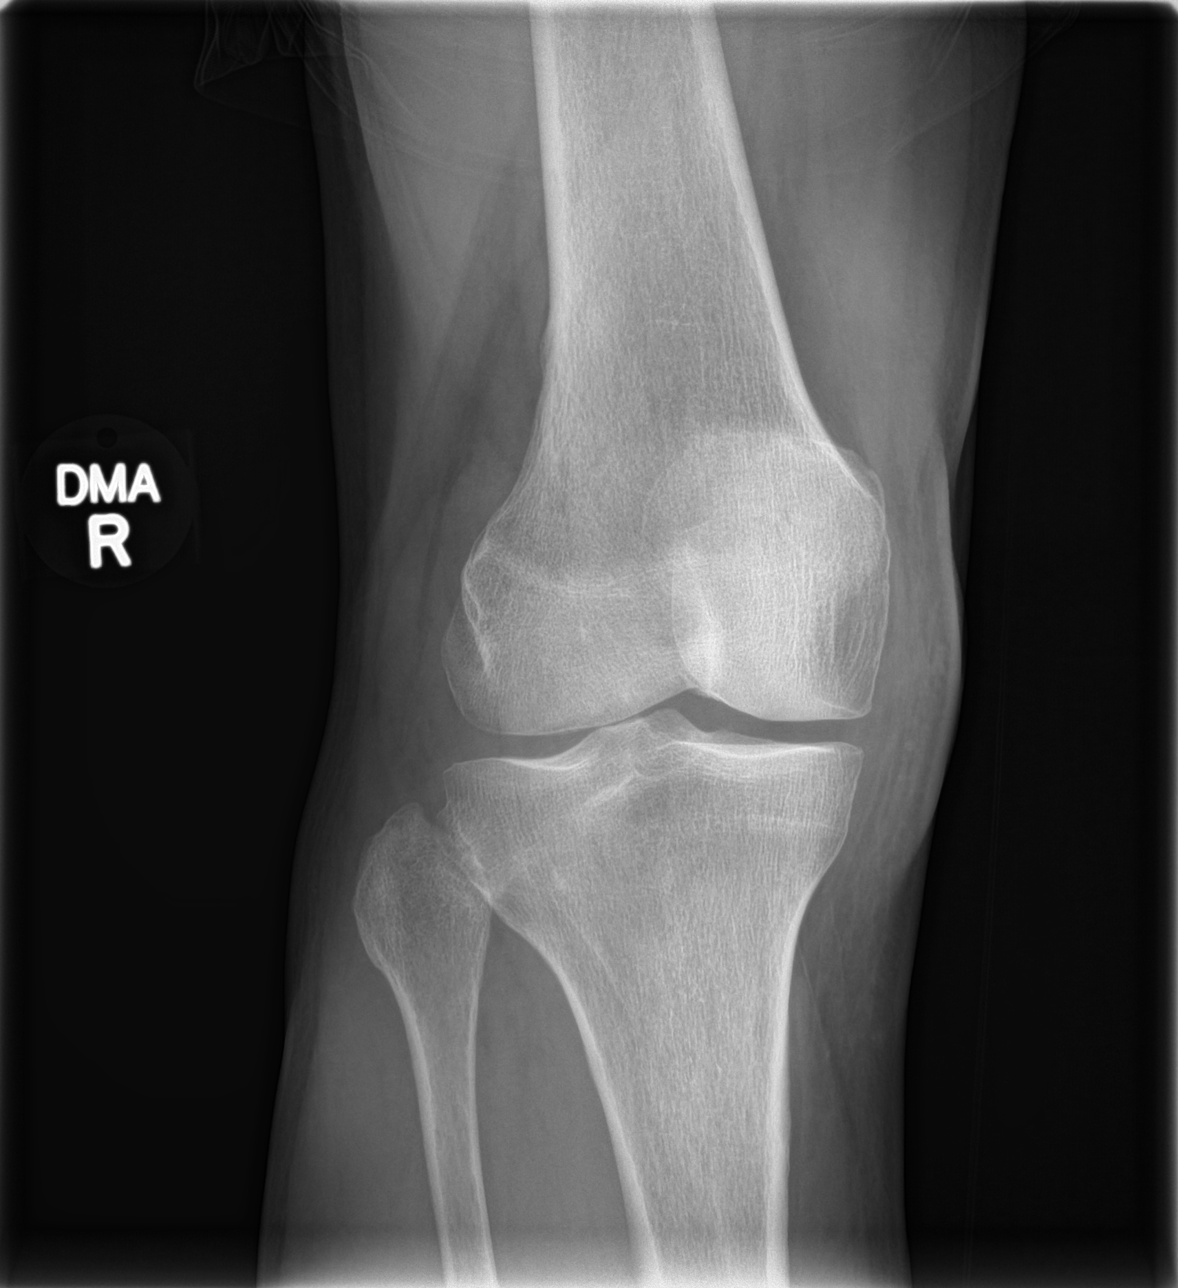

[knee obl (2 of 2)]
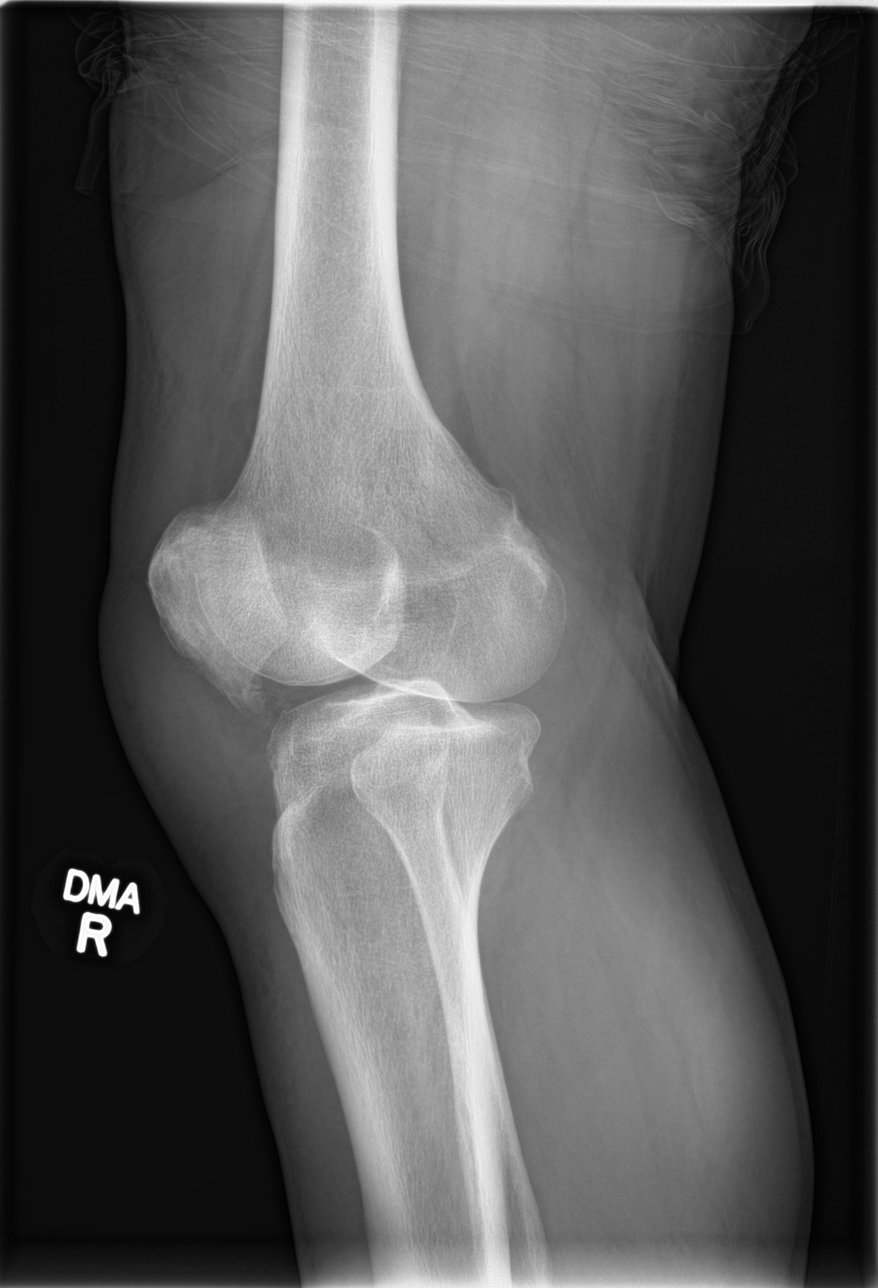

[knee lat]
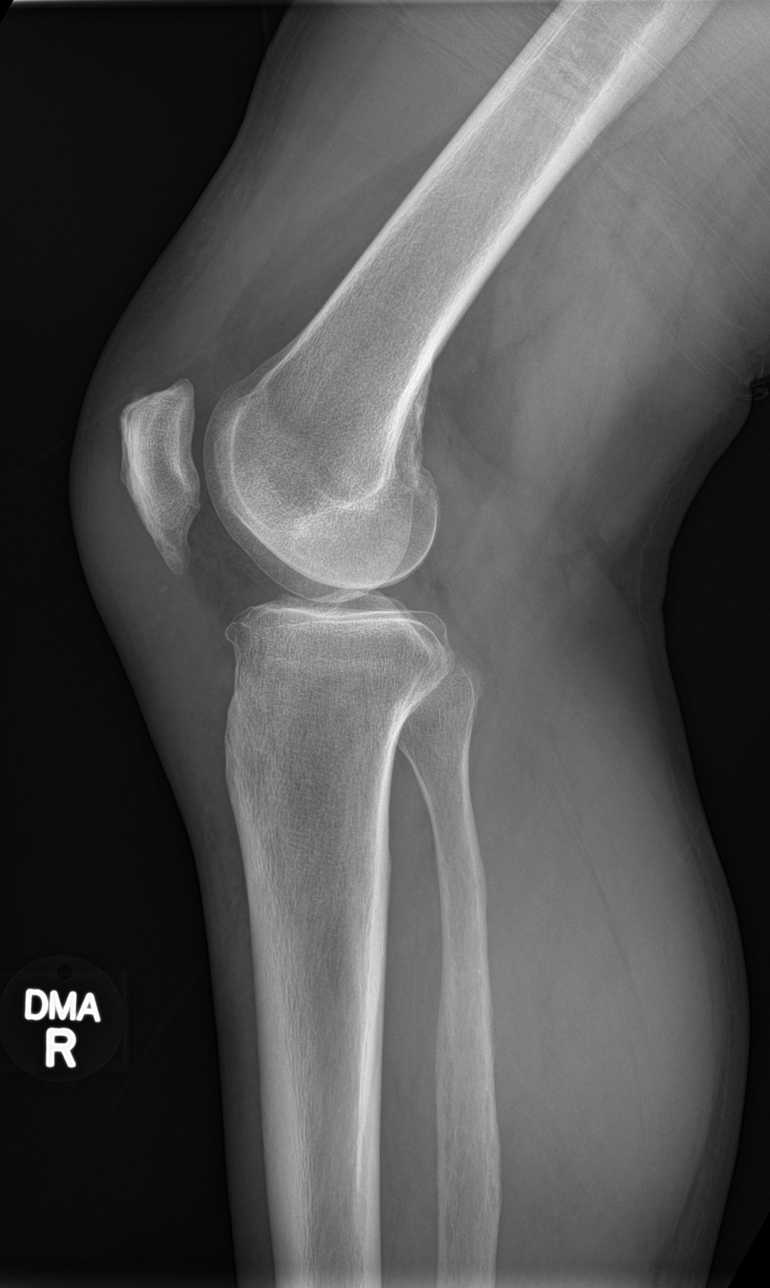

[4 of 4 positions shown; findings below may reference images not displayed]

FINDINGS: Prepatellar soft tissue swelling is identified. There is no joint
effusion. There is a fracture or dislocation identified. Mild medial
compartment and patellofemoral compartment narrowing identified.
There is no evidence of fracture, dislocation, or joint effusion.
IMPRESSION: 1. Prepatellar soft tissue swelling which may reflect soft tissue
inflammation/infection.
2. Mild medial compartment osteoarthritis.

## 2016-10-30 ENCOUNTER — Encounter: Payer: Self-pay | Admitting: Unknown Physician Specialty

## 2016-10-30 ENCOUNTER — Ambulatory Visit (INDEPENDENT_AMBULATORY_CARE_PROVIDER_SITE_OTHER): Payer: 59 | Admitting: Unknown Physician Specialty

## 2016-10-30 VITALS — BP 129/87 | HR 79 | Temp 98.1°F | Ht 63.7 in | Wt 168.8 lb

## 2016-10-30 DIAGNOSIS — Z0000 Encounter for general adult medical examination without abnormal findings: Secondary | ICD-10-CM

## 2016-10-30 DIAGNOSIS — J0100 Acute maxillary sinusitis, unspecified: Secondary | ICD-10-CM | POA: Diagnosis not present

## 2016-10-30 DIAGNOSIS — Z0001 Encounter for general adult medical examination with abnormal findings: Secondary | ICD-10-CM

## 2016-10-30 DIAGNOSIS — M542 Cervicalgia: Secondary | ICD-10-CM | POA: Diagnosis not present

## 2016-10-30 DIAGNOSIS — L4050 Arthropathic psoriasis, unspecified: Secondary | ICD-10-CM

## 2016-10-30 DIAGNOSIS — Z23 Encounter for immunization: Secondary | ICD-10-CM

## 2016-10-30 MED ORDER — CYCLOBENZAPRINE HCL 10 MG PO TABS: 10.0000 mg | ORAL_TABLET | Freq: Every day | ORAL | 1 refills | Status: DC

## 2016-10-30 MED ORDER — CEFDINIR 300 MG PO CAPS: 300.0000 mg | ORAL_CAPSULE | Freq: Two times a day (BID) | ORAL | 0 refills | Status: DC

## 2016-10-30 MED ORDER — METHYLPREDNISOLONE 4 MG PO TBPK: ORAL_TABLET | ORAL | 0 refills | Status: DC

## 2016-10-30 NOTE — Patient Instructions (Addendum)
Influenza (Flu) Vaccine (Inactivated or Recombinant): What You Need to Know 1. Why get vaccinated? Influenza ("flu") is a contagious disease that spreads around the [place] every year, usually between October and May. Flu is caused by influenza viruses, and is spread mainly by coughing, sneezing, and close contact. Anyone can get flu. Flu strikes suddenly and can last several days. Symptoms vary by age, but can include:  fever/chills  sore throat  muscle aches  fatigue  cough  headache  runny or stuffy nose Flu can also lead to pneumonia and blood infections, and cause diarrhea and seizures in children. If you have a medical condition, such as heart or lung disease, flu can make it worse. Flu is more dangerous for some people. Infants and young children, people 65 years of age and older, pregnant women, and people with certain health conditions or a weakened immune system are at greatest risk. Each year thousands of people in the [place] die from flu, and many more are hospitalized. Flu vaccine can:  keep you from getting flu,  make flu less severe if you do get it, and  keep you from spreading flu to your family and other people. 2. Inactivated and recombinant flu vaccines A dose of flu vaccine is recommended every flu season. Children 6 months through 8 years of age may need two doses during the same flu season. Everyone else needs only one dose each flu season. Some inactivated flu vaccines contain a very small amount of a mercury-based preservative called thimerosal. Studies have not shown thimerosal in vaccines to be harmful, but flu vaccines that do not contain thimerosal are available. There is no live flu virus in flu shots. They cannot cause the flu. There are many flu viruses, and they are always changing. Each year a new flu vaccine is made to protect against three or four viruses that are likely to cause disease in the upcoming flu season. But even when the  vaccine doesn't exactly match these viruses, it may still provide some protection. Flu vaccine cannot prevent:  flu that is caused by a virus not covered by the vaccine, or  illnesses that look like flu but are not. It takes about 2 weeks for protection to develop after vaccination, and protection lasts through the flu season. 3. Some people should not get this vaccine Tell the person who is giving you the vaccine:  If you have any severe, life-threatening allergies. If you ever had a life-threatening allergic reaction after a dose of flu vaccine, or have a severe allergy to any part of this vaccine, you may be advised not to get vaccinated. Most, but not all, types of flu vaccine contain a small amount of egg protein.  If you ever had Guillain-Barr Syndrome (also called GBS). Some people with a history of GBS should not get this vaccine. This should be discussed with your doctor.  If you are not feeling well. It is usually okay to get flu vaccine when you have a mild illness, but you might be asked to come back when you feel better. 4. Risks of a vaccine reaction With any medicine, including vaccines, there is a chance of reactions. These are usually mild and go away on their own, but serious reactions are also possible. Most people who get a flu shot do not have any problems with it. Minor problems following a flu shot include:  soreness, redness, or swelling where the shot was given  hoarseness  sore, red or itchy   eyes  cough  fever  aches  headache  itching  fatigue If these problems occur, they usually begin soon after the shot and last 1 or 2 days. More serious problems following a flu shot can include the following:  There may be a small increased risk of Guillain-Barre Syndrome (GBS) after inactivated flu vaccine. This risk has been estimated at 1 or 2 additional cases per million people vaccinated. This is much lower than the risk of severe complications from flu,  which can be prevented by flu vaccine.  Young children who get the flu shot along with pneumococcal vaccine (PCV13) and/or DTaP vaccine at the same time might be slightly more likely to have a seizure caused by fever. Ask your doctor for more information. Tell your doctor if a child who is getting flu vaccine has ever had a seizure. Problems that could happen after any injected vaccine:  People sometimes faint after a medical procedure, including vaccination. Sitting or lying down for about 15 minutes can help prevent fainting, and injuries caused by a fall. Tell your doctor if you feel dizzy, or have vision changes or ringing in the ears.  Some people get severe pain in the shoulder and have difficulty moving the arm where a shot was given. This happens very rarely.  Any medication can cause a severe allergic reaction. Such reactions from a vaccine are very rare, estimated at about 1 in a million doses, and would happen within a few minutes to a few hours after the vaccination. As with any medicine, there is a very remote chance of a vaccine causing a serious injury or death. The safety of vaccines is always being monitored. For more information, visit: www.cdc.gov/vaccinesafety/ 5. What if there is a serious reaction? What should I look for? Look for anything that concerns you, such as signs of a severe allergic reaction, very high fever, or unusual behavior. Signs of a severe allergic reaction can include hives, swelling of the face and throat, difficulty breathing, a fast heartbeat, dizziness, and weakness. These would start a few minutes to a few hours after the vaccination. What should I do?  If you think it is a severe allergic reaction or other emergency that can't wait, call 9-1-1 and get the person to the nearest hospital. Otherwise, call your doctor.  Reactions should be reported to the Vaccine Adverse Event Reporting System (VAERS). Your doctor should file this report, or you can do  it yourself through the VAERS web site at www.vaers.hhs.gov, or by calling 1-800-822-7967.  VAERS does not give medical advice. 6. The National Vaccine Injury Compensation Program The National Vaccine Injury Compensation Program (VICP) is a federal program that was created to compensate people who may have been injured by certain vaccines. Persons who believe they may have been injured by a vaccine can learn about the program and about filing a claim by calling 1-800-338-2382 or visiting the VICP website at www.hrsa.gov/vaccinecompensation. There is a time limit to file a claim for compensation. 7. How can I learn more?  Ask your healthcare provider. He or she can give you the vaccine package insert or suggest other sources of information.  Call your local or state health department.  Contact the Centers for Disease Control and Prevention (CDC):  Call 1-800-232-4636 (1-800-CDC-INFO) or  Visit CDC's website at www.cdc.gov/flu Vaccine Information Statement, Inactivated Influenza Vaccine (06/29/2014) This information is not intended to replace advice given to you by your health care provider. Make sure you discuss any questions you   have with your health care provider.   Neck Exercises Neck exercises can be important for many reasons:  They can help you to improve and maintain flexibility in your neck. This can be especially important as you age.  They can help to make your neck stronger. This can make movement easier.  They can reduce or prevent neck pain.  They may help your upper back. Ask your health care provider which neck exercises would be best for you. Exercises Neck Press  Repeat this exercise 10 times. Do it first thing in the morning and right before bed or as told by your health care provider. 1. Lie on your back on a firm bed or on the floor with a pillow under your head. 2. Use your neck muscles to push your head down on the pillow and straighten your spine. 3. Hold  the position as well as you can. Keep your head facing up and your chin tucked. 4. Slowly count to 5 while holding this position. 5. Relax for a few seconds. Then repeat. Isometric Strengthening  Do a full set of these exercises 2 times a day or as told by your health care provider. 1. Sit in a supportive chair and place your hand on your forehead. 2. Push forward with your head and neck while pushing back with your hand. Hold for 10 seconds. 3. Relax. Then repeat the exercise 3 times. 4. Next, do thesequence again, this time putting your hand against the back of your head. Use your head and neck to push backward against the hand pressure. 5. Finally, do the same exercise on either side of your head, pushing sideways against the pressure of your hand. Prone Head Lifts  Repeat this exercise 5 times. Do this 2 times a day or as told by your health care provider. 1. Lie face-down, resting on your elbows so that your chest and upper back are raised. 2. Start with your head facing downward, near your chest. Position your chin either on or near your chest. 3. Slowly lift your head upward. Lift until you are looking straight ahead. Then continue lifting your head as far back as you can stretch. 4. Hold your head up for 5 seconds. Then slowly lower it to your starting position. Supine Head Lifts  Repeat this exercise 8-10 times. Do this 2 times a day or as told by your health care provider. 1. Lie on your back, bending your knees to point to the ceiling and keeping your feet flat on the floor. 2. Lift your head slowly off the floor, raising your chin toward your chest. 3. Hold for 5 seconds. 4. Relax and repeat. Scapular Retraction  Repeat this exercise 5 times. Do this 2 times a day or as told by your health care provider. 1. Stand with your arms at your sides. Look straight ahead. 2. Slowly pull both shoulders backward and downward until you feel a stretch between your shoulder blades in your  upper back. 3. Hold for 10-30 seconds. 4. Relax and repeat. Contact a health care provider if:  Your neck pain or discomfort gets much worse when you do an exercise.  Your neck pain or discomfort does not improve within 2 hours after you exercise. If you have any of these problems, stop exercising right away. Do not do the exercises again unless your health care provider says that you can. Get help right away if:  You develop sudden, severe neck pain. If this happens, stop exercising right away.  Do not do the exercises again unless your health care provider says that you can. Exercises Neck Stretch  Repeat this exercise 3-5 times. 1. Do this exercise while standing or while sitting in a chair. 2. Place your feet flat on the floor, shoulder-width apart. 3. Slowly turn your head to the right. Turn it all the way to the right so you can look over your right shoulder. Do not tilt or tip your head. 4. Hold this position for 10-30 seconds. 5. Slowly turn your head to the left, to look over your left shoulder. 6. Hold this position for 10-30 seconds. Neck Retraction  Repeat this exercise 8-10 times. Do this 3-4 times a day or as told by your health care provider. 1. Do this exercise while standing or while sitting in a sturdy chair. 2. Look straight ahead. Do not bend your neck. 3. Use your fingers to push your chin backward. Do not bend your neck for this movement. Continue to face straight ahead. If you are doing the exercise properly, you will feel a slight sensation in your throat and a stretch at the back of your neck. 4. Hold the stretch for 1-2 seconds. Relax and repeat. This information is not intended to replace advice given to you by your health care provider. Make sure you discuss any questions you have with your health care provider. Document Released: 10/21/2015 Document Revised: 04/16/2016 Document Reviewed: 05/20/2015 Elsevier Interactive Patient Education  2017 [employer] Consider Tumeric for joint pain

## 2016-10-30 NOTE — Assessment & Plan Note (Addendum)
Diagnosed by rheumatology.  Hasn't been back.  Pt with right wrist OA.  NSAIDs to take prn.  Wrist splint.  Takes occasional Prednisone

## 2016-10-30 NOTE — Progress Notes (Signed)
+   BP 129/87 (BP Location: Left Arm, Patient Position: Sitting, Cuff Size: Large)   Pulse 79   Temp 98.1 F (36.7 C)   Ht 5' 3.7" (1.618 m)   Wt 168 lb 12.8 oz (76.6 kg)   SpO2 97%   BMI 29.25 kg/m    Subjective:    Patient ID: Benjamin Boone, male    DOB: 09/16/1956, 60 y.o.   MRN: [id number]  HPI: Benjamin Boone is a 60 y.o. male  Chief Complaint  Patient presents with  . Annual Exam   Social History   Social History  . Marital status: Married    Spouse name: N/A  . Number of children: N/A  . Years of education: N/A   Occupational History  . Not on file.   Social History Main Topics  . Smoking status: Never Smoker  . Smokeless tobacco: Never Used  . Alcohol use No  . Drug use: No  . Sexual activity: Yes   Other Topics Concern  . Not on file   Social History Narrative  . No narrative on file   Family History  Problem Relation Age of Onset  . Heart disease Father     MI  . Heart disease Mother     pacemaker   Past Medical History:  Diagnosis Date  . Allergy   . Arthritis   . Hyperlipidemia   . Venous insufficiency    Past Surgical History:  Procedure Laterality Date  . TONSILLECTOMY    . TONSILLECTOMY     Sinusitis  This is a new problem. Episode onset: 2-3 weeks. The problem has been gradually worsening since onset. There has been no fever. The pain is moderate. Associated symptoms include congestion, neck pain and sinus pressure. Past treatments include nothing.  Neck Pain   This is a new problem. Episode onset: 2 weeks. The problem occurs intermittently. The problem has been waxing and waning. The pain is associated with an unknown factor. The pain is present in the right side. The quality of the pain is described as aching. The pain is severe. The symptoms are aggravated by position. The pain is worse during the night. Stiffness is present in the morning. Pertinent negatives include no fever, numbness or tingling. He has tried  NSAIDs for the symptoms. The treatment provided moderate relief.      Relevant past medical, surgical, family and social history reviewed and updated as indicated. Interim medical history since our last visit reviewed. Allergies and medications reviewed and updated.  Review of Systems  Constitutional: Negative.  Negative for fever.  HENT: Positive for congestion, ear discharge and sinus pressure.        Sinuses are bothering him with drainage  Eyes: Negative.   Respiratory: Negative.   Cardiovascular: Negative.   Gastrointestinal: Negative.   Endocrine: Negative.   Genitourinary: Negative.   Musculoskeletal: Positive for neck pain.       Right wrist with lump and having pain  Neurological: Negative for tingling and numbness.  Hematological: Negative.   Psychiatric/Behavioral: Negative.     Per HPI unless specifically indicated above     Objective:    BP 129/87 (BP Location: Left Arm, Patient Position: Sitting, Cuff Size: Large)   Pulse 79   Temp 98.1 F (36.7 C)   Ht 5' 3.7" (1.618 m)   Wt 168 lb 12.8 oz (76.6 kg)   SpO2 97%   BMI 29.25 kg/m   Wt Readings from Last 3 Encounters:  10/30/16  168 lb 12.8 oz (76.6 kg)  04/03/16 164 lb 12.8 oz (74.8 kg)  12/04/15 162 lb (73.5 kg)    Physical Exam  Constitutional: He is oriented to person, place, and time. He appears well-developed and well-nourished.  HENT:  Head: Normocephalic.  Right Ear: Tympanic membrane, external ear and ear canal normal.  Left Ear: Tympanic membrane, external ear and ear canal normal.  Mouth/Throat: Uvula is midline, oropharynx is clear and moist and mucous membranes are normal.  Eyes: Pupils are equal, round, and reactive to light.  Cardiovascular: Normal rate, regular rhythm and normal heart sounds.  Exam reveals no gallop and no friction rub.   No murmur heard. Pulmonary/Chest: Effort normal and breath sounds normal. No respiratory distress.  Abdominal: Soft. Bowel sounds are normal. He  exhibits no distension. There is no tenderness.  Genitourinary: Rectum normal and prostate normal.  Musculoskeletal: Normal range of motion.  Neurological: He is alert and oriented to person, place, and time. He has normal reflexes.  Skin: Skin is warm and dry.  Psoriatic changes knee  Psychiatric: He has a normal mood and affect. His behavior is normal. Judgment and thought content normal.    Results for orders placed or performed in visit on 10/30/15  CBC with Differential/Platelet  Result Value Ref Range   WBC 7.0 3.4 - 10.8 x10E3/uL   RBC 5.41 4.14 - 5.80 x10E6/uL   Hemoglobin 15.5 12.6 - 17.7 g/dL   Hematocrit 78.2 95.6 - 51.0 %   MCV 83 79 - 97 fL   MCH 28.7 26.6 - 33.0 pg   MCHC 34.6 31.5 - 35.7 g/dL   RDW 21.3 08.6 - 57.8 %   Platelets 288 150 - 379 x10E3/uL   Neutrophils 53 %   Lymphs 32 %   Monocytes 7 %   Eos 7 %   Basos 1 %   Neutrophils Absolute 3.7 1.4 - 7.0 x10E3/uL   Lymphocytes Absolute 2.3 0.7 - 3.1 x10E3/uL   Monocytes Absolute 0.5 0.1 - 0.9 x10E3/uL   EOS (ABSOLUTE) 0.5 (H) 0.0 - 0.4 x10E3/uL   Basophils Absolute 0.1 0.0 - 0.2 x10E3/uL   Immature Granulocytes 0 %   Immature Grans (Abs) 0.0 0.0 - 0.1 x10E3/uL  Comprehensive metabolic panel  Result Value Ref Range   Glucose 85 65 - 99 mg/dL   BUN 13 6 - 24 mg/dL   Creatinine, Ser 4.69 0.76 - 1.27 mg/dL   GFR calc non Af Amer 75 >59 mL/min/1.73   GFR calc Af Amer 87 >59 mL/min/1.73   BUN/Creatinine Ratio 12 9 - 20   Sodium 137 136 - 144 mmol/L   Potassium 4.5 3.5 - 5.2 mmol/L   Chloride 99 97 - 106 mmol/L   CO2 24 18 - 29 mmol/L   Calcium 9.8 8.7 - 10.2 mg/dL   Total Protein 6.8 6.0 - 8.5 g/dL   Albumin 4.1 3.5 - 5.5 g/dL   Globulin, Total 2.7 1.5 - 4.5 g/dL   Albumin/Globulin Ratio 1.5 1.1 - 2.5   Bilirubin Total 0.3 0.0 - 1.2 mg/dL   Alkaline Phosphatase 89 39 - 117 IU/L   AST 22 0 - 40 IU/L   ALT 21 0 - 44 IU/L  PSA  Result Value Ref Range   Prostate Specific Ag, Serum 0.9 0.0 - 4.0 ng/mL    TSH  Result Value Ref Range   TSH 3.910 0.450 - 4.500 uIU/mL  Hepatitis C antibody  Result Value Ref Range   Hep C Virus Ab <  0.1 0.0 - 0.9 s/co ratio  HIV antibody  Result Value Ref Range   HIV Screen 4th Generation wRfx Non Reactive Non Reactive  Lipid Panel w/o Chol/HDL Ratio  Result Value Ref Range   Cholesterol, Total 229 (H) 100 - 199 mg/dL   Triglycerides 409381 (H) 0 - 149 mg/dL   HDL 35 (L) >81>39 mg/dL   VLDL Cholesterol Cal 76 (H) 5 - 40 mg/dL   LDL Calculated 191118 (H) 0 - 99 mg/dL      Assessment & Plan:   Problem List Items Addressed This Visit      Unprioritized   Neck pain on right side    Exercises given.  Flexeril QHS prn plus Medrol dose pack for neck and wrist      Psoriatic arthritis (HCC)    Diagnosed by rheumatology.  Hasn't been back.  Pt with right wrist OA.  NSAIDs to take prn.  Wrist splint.  Takes occasional Prednisone       Other Visit Diagnoses    Need for influenza vaccination    -  Primary   Relevant Orders   Flu Vaccine QUAD 36+ mos IM (Completed)   Annual physical exam       Relevant Orders   CBC with Differential/Platelet   Comprehensive metabolic panel   Lipid Panel w/o Chol/HDL Ratio   PSA   TSH   Acute non-recurrent maxillary sinusitis       Rx for Omnicef   Relevant Medications   cefdinir (OMNICEF) 300 MG capsule   methylPREDNISolone (MEDROL DOSEPAK) 4 MG TBPK tablet       Follow up plan: Return in about 1 year (around 10/30/2017), or if symptoms worsen or fail to improve.

## 2016-10-30 NOTE — Assessment & Plan Note (Addendum)
Exercises given.  Flexeril QHS prn plus Medrol dose pack for neck and wrist

## 2016-10-31 LAB — CBC WITH DIFFERENTIAL/PLATELET
BASOS ABS: 0.1 10*3/uL (ref 0.0–0.2)
Basos: 1 %
EOS (ABSOLUTE): 0.8 10*3/uL — AB (ref 0.0–0.4)
Eos: 10 %
Hematocrit: 41.2 % (ref 37.5–51.0)
Hemoglobin: 14.4 g/dL (ref 13.0–17.7)
IMMATURE GRANS (ABS): 0 10*3/uL (ref 0.0–0.1)
Immature Granulocytes: 0 %
LYMPHS ABS: 2.1 10*3/uL (ref 0.7–3.1)
LYMPHS: 29 %
MCH: 28.3 pg (ref 26.6–33.0)
MCHC: 35 g/dL (ref 31.5–35.7)
MCV: 81 fL (ref 79–97)
Monocytes Absolute: 0.6 10*3/uL (ref 0.1–0.9)
Monocytes: 8 %
Neutrophils Absolute: 3.8 10*3/uL (ref 1.4–7.0)
Neutrophils: 52 %
Platelets: 276 10*3/uL (ref 150–379)
RBC: 5.09 x10E6/uL (ref 4.14–5.80)
RDW: 14.5 % (ref 12.3–15.4)
WBC: 7.3 10*3/uL (ref 3.4–10.8)

## 2016-10-31 LAB — COMPREHENSIVE METABOLIC PANEL
ALK PHOS: 88 IU/L (ref 39–117)
ALT: 23 IU/L (ref 0–44)
AST: 22 IU/L (ref 0–40)
Albumin/Globulin Ratio: 1.6 (ref 1.2–2.2)
Albumin: 4.2 g/dL (ref 3.5–5.5)
BILIRUBIN TOTAL: 0.2 mg/dL (ref 0.0–1.2)
BUN/Creatinine Ratio: 11 (ref 9–20)
BUN: 12 mg/dL (ref 6–24)
CHLORIDE: 102 mmol/L (ref 96–106)
CO2: 22 mmol/L (ref 18–29)
Calcium: 9.5 mg/dL (ref 8.7–10.2)
Creatinine, Ser: 1.05 mg/dL (ref 0.76–1.27)
GFR calc Af Amer: 90 mL/min/{1.73_m2} (ref >59)
GFR calc non Af Amer: 78 mL/min/{1.73_m2} (ref >59)
GLUCOSE: 89 mg/dL (ref 65–99)
Globulin, Total: 2.6 g/dL (ref 1.5–4.5)
Potassium: 3.9 mmol/L (ref 3.5–5.2)
Sodium: 141 mmol/L (ref 134–144)
Total Protein: 6.8 g/dL (ref 6.0–8.5)

## 2016-10-31 LAB — PSA: Prostate Specific Ag, Serum: 1 ng/mL (ref 0.0–4.0)

## 2016-10-31 LAB — TSH: TSH: 4.28 u[IU]/mL (ref 0.450–4.500)

## 2016-10-31 LAB — LIPID PANEL W/O CHOL/HDL RATIO
CHOLESTEROL TOTAL: 250 mg/dL — AB (ref 100–199)
HDL: 31 mg/dL — AB (ref >39)
Triglycerides: 546 mg/dL — ABNORMAL HIGH (ref 0–149)

## 2016-11-02 ENCOUNTER — Telehealth: Payer: Self-pay | Admitting: Unknown Physician Specialty

## 2016-11-02 ENCOUNTER — Encounter: Payer: Self-pay | Admitting: Unknown Physician Specialty

## 2016-11-02 NOTE — Telephone Encounter (Signed)
Discussed with with pt about elevated Triglycerides.  Discuss with pt to eliminate sugar and simple carbohydrates.  Recheck in 3 months

## 2016-11-25 ENCOUNTER — Other Ambulatory Visit
Admission: RE | Admit: 2016-11-25 | Discharge: 2016-11-25 | Disposition: A | Discharge status: Home / Self Care | Payer: 59 | Source: Ambulatory Visit | Attending: Otolaryngology | Admitting: Otolaryngology

## 2016-11-25 DIAGNOSIS — E039 Hypothyroidism, unspecified: Secondary | ICD-10-CM | POA: Insufficient documentation

## 2016-11-25 LAB — T4, FREE: FREE T4: 0.86 ng/dL (ref 0.61–1.12)

## 2016-11-25 LAB — TSH: TSH: 2.546 u[IU]/mL (ref 0.350–4.500)

## 2016-11-26 LAB — T3, FREE: T3, Free: 2.9 pg/mL (ref 2.0–4.4)

## 2016-12-14 ENCOUNTER — Encounter: Payer: Self-pay | Admitting: Unknown Physician Specialty

## 2016-12-14 ENCOUNTER — Ambulatory Visit (INDEPENDENT_AMBULATORY_CARE_PROVIDER_SITE_OTHER): Payer: 59 | Admitting: Unknown Physician Specialty

## 2016-12-14 VITALS — BP 121/81 | HR 76 | Temp 98.0°F | Wt 168.2 lb

## 2016-12-14 DIAGNOSIS — J0101 Acute recurrent maxillary sinusitis: Secondary | ICD-10-CM

## 2016-12-14 MED ORDER — CEFDINIR 300 MG PO CAPS: 300.0000 mg | ORAL_CAPSULE | Freq: Two times a day (BID) | ORAL | 0 refills | Status: DC

## 2016-12-14 NOTE — Progress Notes (Signed)
BP 121/81 (BP Location: Left Arm, Patient Position: Sitting, Cuff Size: Large)   Pulse 76   Temp 98 F (36.7 C)   Wt 168 lb 3.2 oz (76.3 kg)   SpO2 97%   BMI 29.14 kg/m    Subjective:    Patient ID: Benjamin Boone, male    DOB: 07/03/1956, 61 y.o.   MRN: [id number]  HPI: Benjamin Boone is a 61 y.o. male  Chief Complaint  Patient presents with  . URI    pt states he was seen in Decemeber for sinus infection, got a little better but not completely, and feels like the same thing has come back on again.    Sinusitis  Chronicity: Never got completely better from when he got Omnicef on 12/8. The problem has been gradually worsening since onset. There has been no fever. Associated symptoms include congestion, ear pain and sinus pressure.    Relevant past medical, surgical, family and social history reviewed and updated as indicated. Interim medical history since our last visit reviewed. Allergies and medications reviewed and updated.  Review of Systems  HENT: Positive for congestion, ear pain and sinus pressure.     Per HPI unless specifically indicated above     Objective:    BP 121/81 (BP Location: Left Arm, Patient Position: Sitting, Cuff Size: Large)   Pulse 76   Temp 98 F (36.7 C)   Wt 168 lb 3.2 oz (76.3 kg)   SpO2 97%   BMI 29.14 kg/m   Wt Readings from Last 3 Encounters:  12/14/16 168 lb 3.2 oz (76.3 kg)  10/30/16 168 lb 12.8 oz (76.6 kg)  04/03/16 164 lb 12.8 oz (74.8 kg)    Physical Exam  Constitutional: He is oriented to person, place, and time. He appears well-developed and well-nourished. No distress.  HENT:  Head: Normocephalic and atraumatic.  Right Ear: Tympanic membrane and ear canal normal.  Left Ear: Tympanic membrane and ear canal normal.  Nose: Mucosal edema and sinus tenderness present. Right sinus exhibits maxillary sinus tenderness. Left sinus exhibits maxillary sinus tenderness.  Mouth/Throat: Mucous membranes are normal.  Oropharyngeal exudate and posterior oropharyngeal edema present.  Eyes: Conjunctivae and lids are normal. Right eye exhibits no discharge. Left eye exhibits no discharge. No scleral icterus.  Cardiovascular: Normal rate and regular rhythm.   Pulmonary/Chest: Effort normal. No respiratory distress.  Abdominal: Normal appearance and bowel sounds are normal. He exhibits no distension. There is no splenomegaly or hepatomegaly. There is no tenderness.  Musculoskeletal: Normal range of motion.  Neurological: He is alert and oriented to person, place, and time.  Skin: Skin is intact. No rash noted. No pallor.  Psychiatric: He has a normal mood and affect. His behavior is normal. Judgment and thought content normal.    Results for orders placed or performed during the hospital encounter of 11/25/16  TSH  Result Value Ref Range   TSH 2.546 0.350 - 4.500 uIU/mL  T4, free  Result Value Ref Range   Free T4 0.86 0.61 - 1.12 ng/dL  T3, Free  Result Value Ref Range   T3, Free 2.9 2.0 - 4.4 pg/mL      Assessment & Plan:   Problem List Items Addressed This Visit    None    Visit Diagnoses    Acute recurrent maxillary sinusitis    -  Primary   Repeat Omnicef rx and will give a 2 week course rather than 10 days   Relevant Medications  cefdinir (OMNICEF) 300 MG capsule       Follow up plan: Return if symptoms worsen or fail to improve.

## 2017-01-06 ENCOUNTER — Ambulatory Visit (INDEPENDENT_AMBULATORY_CARE_PROVIDER_SITE_OTHER): Payer: 59 | Admitting: Unknown Physician Specialty

## 2017-01-06 ENCOUNTER — Encounter: Payer: Self-pay | Admitting: Unknown Physician Specialty

## 2017-01-06 DIAGNOSIS — J329 Chronic sinusitis, unspecified: Secondary | ICD-10-CM | POA: Diagnosis not present

## 2017-01-06 DIAGNOSIS — M62838 Other muscle spasm: Secondary | ICD-10-CM

## 2017-01-06 NOTE — Progress Notes (Signed)
BP 135/88 (BP Location: Left Arm, Patient Position: Sitting, Cuff Size: Normal)   Pulse 78   Temp 97.9 F (36.6 C)   Wt 168 lb 9.6 oz (76.5 kg)   SpO2 97%   BMI 29.21 kg/m    Subjective:    Patient ID: Benjamin Boone, male    DOB: December 12, 1955, 60 y.o.   MRN: [id number]  HPI: Benjamin Boone is a 61 y.o. male  Chief Complaint  Patient presents with  . URI    pt states he is a little bit better from when he was seen a few weeks ago but not completely, states he thinks he pulled a muscle near right shoulder from coughing    Pt has been treated with 2 courses of antibiotics.  Last course was 2 weeks of Omnicef.   Pt states he got better but not completely and is now worse again.  Primary complaint is drainage and coughing.    Pt states he pulled a muscle 3 days ago with coughing.  Better today than yesterday.  Describes pain when he moves and coughs.  Difficulty sleeping  Relevant past medical, surgical, family and social history reviewed and updated as indicated. Interim medical history since our last visit reviewed. Allergies and medications reviewed and updated.  Review of Systems  Per HPI unless specifically indicated above     Objective:    BP 135/88 (BP Location: Left Arm, Patient Position: Sitting, Cuff Size: Normal)   Pulse 78   Temp 97.9 F (36.6 C)   Wt 168 lb 9.6 oz (76.5 kg)   SpO2 97%   BMI 29.21 kg/m   Wt Readings from Last 3 Encounters:  01/06/17 168 lb 9.6 oz (76.5 kg)  12/14/16 168 lb 3.2 oz (76.3 kg)  10/30/16 168 lb 12.8 oz (76.6 kg)    Physical Exam  Constitutional: He is oriented to person, place, and time. He appears well-developed and well-nourished. No distress.  HENT:  Head: Normocephalic and atraumatic.  Right Ear: Tympanic membrane and ear canal normal.  Left Ear: Tympanic membrane and ear canal normal.  Nose: Mucosal edema and sinus tenderness present. Right sinus exhibits maxillary sinus tenderness. Left sinus exhibits  maxillary sinus tenderness.  Mouth/Throat: Mucous membranes are normal. Oropharyngeal exudate and posterior oropharyngeal edema present.  Eyes: Conjunctivae and lids are normal. Right eye exhibits no discharge. Left eye exhibits no discharge. No scleral icterus.  Cardiovascular: Normal rate and regular rhythm.   Pulmonary/Chest: Effort normal. No respiratory distress.  Abdominal: Normal appearance and bowel sounds are normal. He exhibits no distension. There is no splenomegaly or hepatomegaly. There is no tenderness.  Musculoskeletal: Normal range of motion.  Right thoracic paravertebral muscle  with noted spasm  Neurological: He is alert and oriented to person, place, and time.  Skin: Skin is intact. No rash noted. No pallor.  Psychiatric: He has a normal mood and affect. His behavior is normal. Judgment and thought content normal.    Results for orders placed or performed during the hospital encounter of 11/25/16  TSH  Result Value Ref Range   TSH 2.546 0.350 - 4.500 uIU/mL  T4, free  Result Value Ref Range   Free T4 0.86 0.61 - 1.12 ng/dL  T3, Free  Result Value Ref Range   T3, Free 2.9 2.0 - 4.4 pg/mL      Assessment & Plan:   Problem List Items Addressed This Visit      Unprioritized   Muscle spasm of right shoulder  PT has Flexeril that he will start      Recurrent sinusitis    Refer to ENT      Relevant Medications   fluticasone (FLONASE) 50 MCG/ACT nasal spray   Other Relevant Orders   Ambulatory referral to ENT       Follow up plan: Return if symptoms worsen or fail to improve.

## 2017-01-06 NOTE — Assessment & Plan Note (Signed)
PT has Flexeril that he will start

## 2017-01-06 NOTE — Assessment & Plan Note (Signed)
Refer to ENT

## 2017-01-19 ENCOUNTER — Ambulatory Visit (INDEPENDENT_AMBULATORY_CARE_PROVIDER_SITE_OTHER): Payer: 59 | Admitting: Cardiology

## 2017-01-19 ENCOUNTER — Encounter: Payer: Self-pay | Admitting: Cardiology

## 2017-01-19 VITALS — BP 116/78 | HR 71 | Ht 66.0 in | Wt 162.0 lb

## 2017-01-19 DIAGNOSIS — R0602 Shortness of breath: Secondary | ICD-10-CM | POA: Diagnosis not present

## 2017-01-19 DIAGNOSIS — M79602 Pain in left arm: Secondary | ICD-10-CM

## 2017-01-19 DIAGNOSIS — Z8249 Family history of ischemic heart disease and other diseases of the circulatory system: Secondary | ICD-10-CM

## 2017-01-19 DIAGNOSIS — R61 Generalized hyperhidrosis: Secondary | ICD-10-CM

## 2017-01-19 NOTE — Progress Notes (Signed)
Cardiology Office Note   Date:  01/19/2017   ID:  Benjamin CedarStephen Edward Melikian, DOB 08/14/1956, MRN [id number]  Referring Doctor:  Gabriel Cirriheryl Wicker, NP   Cardiologist:   Almond LintAileen Bandy Honaker, MD   Reason for consultation:  Chief Complaint  Patient presents with  . other    Consult for episode woke up in the middle of the night, cold clammy sweat w/ left arm pain. Meds reviewed verbally with pt.      History of Present Illness: Benjamin Boone is a 61 y.o. male who presents for Episode of diaphoresis, left arm pain  This happened maybe 2 weeks ago. He woke up all of a sudden in the middle of the night, sweating profusely, noted to be cold and clammy per the wife, complaining of throbbing left arm pain. This was 8 out of 10 in severity, lasting more than 10 minutes. Shortness of breath. No tightness in the chest. This was quite unusual for him. He didn't feel like the typical slept on his left arm, and sensation. He was bothered by that episode and wanted to get it checked out.  Otherwise, no chest pain or shortness of breath on a regular basis. No palpitations. No syncope.  There is significant family history of coronary artery disease on father's side in mind CABG in his likely 3160s or earlier. Maternal uncle had heart disease as well.   ROS:  Please see the history of present illness. Aside from mentioned under HPI, all other systems are reviewed and negative.     Past Medical History:  Diagnosis Date  . Allergy   . Arthritis   . Hyperlipidemia   . Venous insufficiency     Past Surgical History:  Procedure Laterality Date  . TONSILLECTOMY    . TONSILLECTOMY       reports that he has never smoked. He has never used smokeless tobacco. He reports that he does not drink alcohol or use drugs.   family history includes Heart attack in his maternal uncle; Heart disease in his father and mother.   Outpatient Medications Prior to Visit  Medication Sig Dispense Refill  . aspirin  81 MG tablet Take 81 mg by mouth daily.    . fluticasone (FLONASE) 50 MCG/ACT nasal spray Place 1 spray into both nostrils daily.     No facility-administered medications prior to visit.      Allergies: Doxycycline    PHYSICAL EXAM: VS:  BP 116/78 (BP Location: Right Arm, Patient Position: Sitting, Cuff Size: Normal)   Pulse 71   Ht 5\' 6"  (1.676 m)   Wt 162 lb (73.5 kg)   BMI 26.15 kg/m  , Body mass index is 26.15 kg/m. Wt Readings from Last 3 Encounters:  01/19/17 162 lb (73.5 kg)  01/06/17 168 lb 9.6 oz (76.5 kg)  12/14/16 168 lb 3.2 oz (76.3 kg)    GENERAL:  well developed, well nourished, not in acute distress HEENT: normocephalic, pink conjunctivae, anicteric sclerae, no xanthelasma, normal dentition, oropharynx clear NECK:  no neck vein engorgement, JVP normal, no hepatojugular reflux, carotid upstroke brisk and symmetric, no bruit, no thyromegaly, no lymphadenopathy LUNGS:  good respiratory effort, clear to auscultation bilaterally CV:  PMI not displaced, no thrills, no lifts, S1 and S2 within normal limits, no palpable S3 or S4, no murmurs, no rubs, no gallops ABD:  Soft, nontender, nondistended, normoactive bowel sounds, no abdominal aortic bruit, no hepatomegaly, no splenomegaly MS: nontender back, no kyphosis, no scoliosis, no joint deformities EXT:  2+ DP/PT pulses, no edema, no varicosities, no cyanosis, no clubbing SKIN: warm, nondiaphoretic, normal turgor, no ulcers NEUROPSYCH: alert, oriented to person, place, and time, sensory/motor grossly intact, normal mood, appropriate affect  Recent Labs: 10/30/2016: ALT 23; BUN 12; Creatinine, Ser 1.05; Platelets 276; Potassium 3.9; Sodium 141 11/25/2016: TSH 2.546   Lipid Panel    Component Value Date/Time   CHOL 250 (H) 10/30/2016 0840   TRIG 546 (H) 10/30/2016 0840   HDL 31 (L) 10/30/2016 0840   LDLCALC Comment 10/30/2016 0840     Other studies Reviewed:  EKG:  The ekg from 01/19/2017 was personally reviewed by  me and it revealed sinus rhythm, 71 BPM.  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  The patient does not have concerns regarding medicines.  Labs/ tests ordered today include:  Orders Placed This Encounter  Procedures  . NM Myocar Multi W/Spect W/Wall Motion / EF  . EKG 12-Lead  . ECHOCARDIOGRAM COMPLETE    I had a lengthy and detailed discussion with the patient regarding diagnoses, prognosis, diagnostic options.  I counseled the patient on importance of lifestyle modification including heart healthy diet, regular physical activity Once cardiac workup completed  Disposition:   FU with undersigned after tests   Thank you for this consultation. We will forwarding this consultation to referring physician.   Signed, Almond Lint, MD  01/19/2017 4:22 PM    [facility]  This note was generated in part with voice recognition software and I apologize for any typographical errors that were not detected and corrected.

## 2017-01-19 NOTE — Patient Instructions (Addendum)
Testing/Procedures: Your physician has requested that you have an echocardiogram. Echocardiography is a painless test that uses sound waves to create images of your heart. It provides your doctor with information about the size and shape of your heart and how well your heart's chambers and valves are working. This procedure takes approximately one hour. There are no restrictions for this procedure.  [facility] MYOVIEW  Your caregiver has ordered a Stress Test with nuclear imaging. The purpose of this test is to evaluate the blood supply to your heart muscle. This procedure is referred to as a "Non-Invasive Stress Test." This is because other than having an IV started in your vein, nothing is inserted or "invades" your body. Cardiac stress tests are done to find areas of poor blood flow to the heart by determining the extent of coronary artery disease (CAD).   Please note: these test may take anywhere between 2-4 hours to complete  PLEASE REPORT TO [facility] MEDICAL MALL ENTRANCE  THE VOLUNTEERS AT THE FIRST DESK WILL DIRECT YOU WHERE TO GO  Date of Procedure:_Thursday January 28, 2017 at 08:00AM_  Arrival Time for Procedure:__Arrive at 07:45AM to register__   PLEASE NOTIFY THE OFFICE AT LEAST 24 HOURS IN ADVANCE IF YOU ARE UNABLE TO KEEP YOUR APPOINTMENT.  [phone] AND  PLEASE NOTIFY NUCLEAR MEDICINE AT [facility] AT LEAST 24 HOURS IN ADVANCE IF YOU ARE UNABLE TO KEEP YOUR APPOINTMENT. [phone]  How to prepare for your Myoview test:  1. Do not eat or drink after midnight 2. No caffeine for 24 hours prior to test 3. No smoking 24 hours prior to test. 4. Your medication may be taken with water.  If your doctor stopped a medication because of this test, do not take that medication. 5. Ladies, please do not wear dresses.  Skirts or pants are appropriate. Please wear a short sleeve shirt. 6. No perfume, cologne or lotion. 7. Wear comfortable walking shoes. No heels!   Follow-Up: Your physician  recommends that you schedule a follow-up appointment as needed. We will call you with results and if needed schedule follow up at that time.   It was a pleasure seeing you today here in the office. Please do not hesitate to give [place] a call back if you have any further questions. [phone]  Tyonek Cellar RN, BSN    Echocardiogram An echocardiogram, or echocardiography, uses sound waves (ultrasound) to produce an image of your heart. The echocardiogram is simple, painless, obtained within a short period of time, and offers valuable information to your health care provider. The images from an echocardiogram can provide information such as:  Evidence of coronary artery disease (CAD).  Heart size.  Heart muscle function.  Heart valve function.  Aneurysm detection.  Evidence of a past heart attack.  Fluid buildup around the heart.  Heart muscle thickening.  Assess heart valve function. Tell a health care provider about:  Any allergies you have.  All medicines you are taking, including vitamins, herbs, eye drops, creams, and over-the-counter medicines.  Any problems you or family members have had with anesthetic medicines.  Any blood disorders you have.  Any surgeries you have had.  Any medical conditions you have.  Whether you are pregnant or may be pregnant. What happens before the procedure? No special preparation is needed. Eat and drink normally. What happens during the procedure?  In order to produce an image of your heart, gel will be applied to your chest and a wand-like tool (transducer) will be moved over  your chest. The gel will help transmit the sound waves from the transducer. The sound waves will harmlessly bounce off your heart to allow the heart images to be captured in real-time motion. These images will then be recorded.  You may need an IV to receive a medicine that improves the quality of the pictures. What happens after the procedure? You may return to  your normal schedule including diet, activities, and medicines, unless your health care provider tells you otherwise. This information is not intended to replace advice given to you by your health care provider. Make sure you discuss any questions you have with your health care provider. Document Released: 11/06/2000 Document Revised: 06/27/2016 Document Reviewed: 07/17/2013 Elsevier Interactive Patient Education  2017 [employer]vier Inc. Cardiac Nuclear Scan A cardiac nuclear scan is a test that measures blood flow to the heart when a person is resting and when he or she is exercising. The test looks for problems such as:  Not enough blood reaching a portion of the heart.  The heart muscle not working normally. You may need this test if:  You have heart disease.  You have had abnormal lab results.  You have had heart surgery or angioplasty.  You have chest pain.  You have shortness of breath. In this test, a radioactive dye (tracer) is injected into your bloodstream. After the tracer has traveled to your heart, an imaging device is used to measure how much of the tracer is absorbed by or distributed to various areas of your heart. This procedure is usually done at a hospital and takes 2-4 hours. Tell a health care provider about:  Any allergies you have.  All medicines you are taking, including vitamins, herbs, eye drops, creams, and over-the-counter medicines.  Any problems you or family members have had with the use of anesthetic medicines.  Any blood disorders you have.  Any surgeries you have had.  Any medical conditions you have.  Whether you are pregnant or may be pregnant. What are the risks? Generally, this is a safe procedure. However, problems may occur, including:  Serious chest pain and heart attack. This is only a risk if the stress portion of the test is done.  Rapid heartbeat.  Sensation of warmth in your chest. This usually passes quickly. What happens before  the procedure?  Ask your health care provider about changing or stopping your regular medicines. This is especially important if you are taking diabetes medicines or blood thinners.  Remove your jewelry on the day of the procedure. What happens during the procedure?  An IV tube will be inserted into one of your veins.  Your health care provider will inject a small amount of radioactive tracer through the tube.  You will wait for 20-40 minutes while the tracer travels through your bloodstream.  Your heart activity will be monitored with an electrocardiogram (ECG).  You will lie down on an exam table.  Images of your heart will be taken for about 15-20 minutes.  You may be asked to exercise on a treadmill or stationary bike. While you exercise, your heart's activity will be monitored with an ECG, and your blood pressure will be checked. If you are unable to exercise, you may be given a medicine to increase blood flow to parts of your heart.  When blood flow to your heart has peaked, a tracer will again be injected through the IV tube.  After 20-40 minutes, you will get back on the exam table and have more images  taken of your heart.  When the procedure is over, your IV tube will be removed. The procedure may vary among health care providers and hospitals. Depending on the type of tracer used, scans may need to be repeated 3-4 hours later. What happens after the procedure?  Unless your health care provider tells you otherwise, you may return to your normal schedule, including diet, activities, and medicines.  Unless your health care provider tells you otherwise, you may increase your fluid intake. This will help flush the contrast dye from your body. Drink enough fluid to keep your urine clear or pale yellow.  It is up to you to get your test results. Ask your health care provider, or the department that is doing the test, when your results will be ready. Summary  A cardiac nuclear  scan measures the blood flow to the heart when a person is resting and when he or she is exercising.  You may need this test if you are at risk for heart disease.  Tell your health care provider if you are pregnant.  Unless your health care provider tells you otherwise, increase your fluid intake. This will help flush the contrast dye from your body. Drink enough fluid to keep your urine clear or pale yellow. This information is not intended to replace advice given to you by your health care provider. Make sure you discuss any questions you have with your health care provider. Document Released: 12/04/2004 Document Revised: 11/11/2016 Document Reviewed: 10/18/2013 Elsevier Interactive Patient Education  2017 [employer]

## 2017-01-25 ENCOUNTER — Other Ambulatory Visit: Payer: Self-pay

## 2017-01-25 ENCOUNTER — Ambulatory Visit (INDEPENDENT_AMBULATORY_CARE_PROVIDER_SITE_OTHER): Payer: 59

## 2017-01-25 DIAGNOSIS — R0602 Shortness of breath: Secondary | ICD-10-CM

## 2017-01-26 ENCOUNTER — Telehealth: Payer: Self-pay | Admitting: *Deleted

## 2017-01-26 DIAGNOSIS — I77819 Aortic ectasia, unspecified site: Secondary | ICD-10-CM

## 2017-01-26 NOTE — Telephone Encounter (Signed)
Reviewed results and recommendations with patient and put in order for repeat echocardiogram in 1 year. He verbalized understanding of our conversation, agreement with plan, and had no further questions at this time.

## 2017-01-26 NOTE — Telephone Encounter (Signed)
-----   Message from Almond LintAileen Ingal, MD sent at 01/26/2017  9:42 AM EST ----- Heart function ok. Ao root minimally dilated. Rec ffup echo in 1 year. He is scheduled for stress test I believe.

## 2017-01-28 ENCOUNTER — Ambulatory Visit
Admission: RE | Admit: 2017-01-28 | Discharge: 2017-01-28 | Disposition: A | Discharge status: Home / Self Care | Payer: 59 | Source: Ambulatory Visit | Attending: Cardiology | Admitting: Cardiology

## 2017-01-28 DIAGNOSIS — R0602 Shortness of breath: Secondary | ICD-10-CM | POA: Diagnosis not present

## 2017-01-28 LAB — NM MYOCAR MULTI W/SPECT W/WALL MOTION / EF
CHL CUP NUCLEAR SDS: 0
CHL CUP NUCLEAR SSS: 3
CHL CUP RESTING HR STRESS: 69 {beats}/min
CHL CUP STRESS STAGE 1 GRADE: 0 %
CHL CUP STRESS STAGE 1 HR: 89 {beats}/min
CHL CUP STRESS STAGE 1 SPEED: 0 mph
CHL CUP STRESS STAGE 3 DBP: 86 mmHg
CHL CUP STRESS STAGE 4 HR: 112 {beats}/min
CHL CUP STRESS STAGE 4 SPEED: 2.5 mph
CHL CUP STRESS STAGE 5 DBP: 82 mmHg
CHL CUP STRESS STAGE 5 SBP: 161 mmHg
CHL CUP STRESS STAGE 6 SPEED: 0 mph
CSEPED: 8 min
CSEPEW: 10.1 METS
Exercise duration (sec): 59 s
LV dias vol: 44 mL (ref 62–150)
LV sys vol: 17 mL
Peak BP: 161 mmHg
Peak HR: 146 {beats}/min
Percent HR: 90 %
Percent of predicted max HR: 90 %
SRS: 0
Stage 2 Grade: 0 %
Stage 2 HR: 89 {beats}/min
Stage 2 Speed: 0 mph
Stage 3 Grade: 10 %
Stage 3 HR: 95 {beats}/min
Stage 3 SBP: 130 mmHg
Stage 3 Speed: 1.7 mph
Stage 4 DBP: 82 mmHg
Stage 4 Grade: 12 %
Stage 4 SBP: 150 mmHg
Stage 5 Grade: 14 %
Stage 5 HR: 146 {beats}/min
Stage 5 Speed: 3.4 mph
Stage 6 Grade: 0 %
Stage 6 HR: 117 {beats}/min
Stage 7 DBP: 91 mmHg
Stage 7 Grade: 0 %
Stage 7 HR: 90 {beats}/min
Stage 7 SBP: 154 mmHg
Stage 7 Speed: 0 mph
TID: 0.72

## 2017-01-28 MED ORDER — TECHNETIUM TC 99M TETROFOSMIN IV KIT
29.5570 | PACK | Freq: Once | INTRAVENOUS | Status: AC | PRN
  Administered 2017-01-28: 29.557 via INTRAVENOUS

## 2017-01-28 MED ORDER — TECHNETIUM TC 99M TETROFOSMIN IV KIT
13.0000 | PACK | Freq: Once | INTRAVENOUS | Status: AC | PRN
  Administered 2017-01-28: 13.541 via INTRAVENOUS

## 2017-02-02 ENCOUNTER — Telehealth: Payer: Self-pay | Admitting: Cardiology

## 2017-02-02 NOTE — Telephone Encounter (Signed)
Ok with this

## 2017-02-02 NOTE — Telephone Encounter (Signed)
Returned call to patient. He stated he was supposed to originally see Dr Mariah MillingGollan and would like to see him in the future as well. Advised patient on our policy to gather the ok from each provider and that I would send message to Dr Alvino ChapelIngal and Dr Mariah MillingGollan. Then we will notify him once complete. Patient verbalized understanding.

## 2017-02-02 NOTE — Telephone Encounter (Signed)
Happy to see him.

## 2017-02-02 NOTE — Telephone Encounter (Signed)
Pt calling wanting switch providers  He would like to see Dr Mariah MillingGollan  Please advise

## 2017-02-03 NOTE — Telephone Encounter (Signed)
Pt is coming 02/23/17 to see Dr Mariah MillingGollan

## 2017-02-04 ENCOUNTER — Ambulatory Visit: Payer: 59 | Admitting: Cardiology

## 2017-02-20 DIAGNOSIS — R61 Generalized hyperhidrosis: Secondary | ICD-10-CM | POA: Insufficient documentation

## 2017-02-20 DIAGNOSIS — R0602 Shortness of breath: Secondary | ICD-10-CM | POA: Insufficient documentation

## 2017-02-20 DIAGNOSIS — I77810 Thoracic aortic ectasia: Secondary | ICD-10-CM | POA: Insufficient documentation

## 2017-02-20 NOTE — Progress Notes (Signed)
Cardiology Office Note  Date:  02/23/2017   ID:  Neville Walston, DOB [birth year], MRN [id number]  PCP:  Gabriel Cirri, NP   Chief Complaint  Patient presents with  . other    Former Ingal Pt f/u echo and myoview. Meds reviewed verbally with pt.    HPI:  Benjamin Boone is a 61 y.o. male who presents for  Episode of diaphoresis, left arm pain  Previously seen by Dr. Alvino Chapel Echocardiogram and stress test ordered  Echo 3.5.18, Results reviewed with him in detail today Mild to moderately dilated aortic root, 3.9 cm Normal EF, unable to see aortic valve well  Stress test 01/28/17, Results reviewed with him in detail today No ischemia, normal EF  Discussed previous episode with him, woke in the middle of the night, sweating profusely, noted to be cold and clammy per the wife, complaining of throbbing left arm pain. This was 8 out of 10 in severity, lasting more than 10 minutes. Shortness of breath.  No tightness in the chest. This was quite unusual for him. He didn't feel like the typical slept on his left arm, and sensation.  Lasted 10 min  Otherwise, no chest pain or shortness of breath on a regular basis. No palpitations. No syncope.  He denies any further episodes of diaphoresis since his last episode one month ago  There is significant family history of coronary artery disease on father's side in mind CABG in his likely 46s or earlier. Maternal uncle had heart disease as well. Father was a smoker    PMH:   has a past medical history of Allergy; Arthritis; Hyperlipidemia; and Venous insufficiency.  PSH:    Past Surgical History:  Procedure Laterality Date  . TONSILLECTOMY    . TONSILLECTOMY      Current Outpatient Prescriptions  Medication Sig Dispense Refill  . aspirin 81 MG tablet Take 81 mg by mouth daily.    . fluticasone (FLONASE) 50 MCG/ACT nasal spray Place 1 spray into both nostrils daily.     No current facility-administered medications for  this visit.      Allergies:   Doxycycline   Social History:  The patient  reports that he has never smoked. He has never used smokeless tobacco. He reports that he does not drink alcohol or use drugs.   Family History:   family history includes Heart attack in his maternal uncle; Heart disease in his father and mother.    Review of Systems: Review of Systems  Constitutional: Negative.   Respiratory: Negative.   Cardiovascular: Negative.   Gastrointestinal: Negative.   Musculoskeletal: Negative.   Neurological: Negative.   Psychiatric/Behavioral: Negative.   All other systems reviewed and are negative.    PHYSICAL EXAM: VS:  BP 100/78 (BP Location: Left Arm, Patient Position: Sitting, Cuff Size: Normal)   Pulse 72   Ht  (1.676 m)   Wt 165 lb (74.8 kg)   BMI 26.63 kg/m  , BMI Body mass index is 26.63 kg/m. GEN: Well nourished, well developed, in no acute distress  HEENT: normal  Neck: no JVD, carotid bruits, or masses Cardiac: RRR; no murmurs, rubs, or gallops,no edema  Respiratory:  clear to auscultation bilaterally, normal work of breathing GI: soft, nontender, nondistended, + BS MS: no deformity or atrophy  Skin: warm and dry, no rash Neuro:  Strength and sensation are intact Psych: euthymic mood, full affect    Recent Labs: 10/30/2016: ALT 23; BUN 12; Creatinine, Ser 1.05; Platelets 276; Potassium  3.9; Sodium 141 11/25/2016: TSH 2.546    Lipid Panel Lab Results  Component Value Date   CHOL 250 (H) 10/30/2016   HDL 31 (L) 10/30/2016   LDLCALC Comment 10/30/2016   TRIG 546 (H) 10/30/2016      Wt Readings from Last 3 Encounters:  02/23/17 165 lb (74.8 kg)  01/19/17 162 lb (73.5 kg)  01/06/17 168 lb 9.6 oz (76.5 kg)       ASSESSMENT AND PLAN:  Mixed hyperlipidemia Cholesterol markedly elevated, discussed with him in detail Discussed CT coronary calcium scoring for risk stratification to help guide whether to go on a statin  Dilated aortic root  (HCC) Echocardiogram results reviewed with him Recommended periodic echo to evaluate aortic root  Diaphoresis No further episodes of diaphoresis, left arm pain Etiology unclear, normal stress test, echo with normal ejection fraction Discussed other screening imaging studies such as CT coronary calcium scoring He will think about this and call us if he would like to scan  SOB (shortness of breath) No significant shortness of breath on exertion Recommended he call us if he has any symptoms  Disposition:   F/U  As needed   Total encounter time more than 25 minutes  Greater than 50% was spent in counseling and coordination of care with the patient   No orders of the defined types were placed in this encounter.    Signed, Dossie Arbour, M.D., Ph.D. 02/23/2017  [facility] [place], [place] [phone]

## 2017-02-23 ENCOUNTER — Encounter: Payer: Self-pay | Admitting: Cardiovascular Disease

## 2017-02-23 ENCOUNTER — Ambulatory Visit (INDEPENDENT_AMBULATORY_CARE_PROVIDER_SITE_OTHER): Payer: 59 | Admitting: Cardiovascular Disease

## 2017-02-23 VITALS — BP 100/78 | HR 72 | Ht 66.0 in | Wt 165.0 lb

## 2017-02-23 DIAGNOSIS — R0602 Shortness of breath: Secondary | ICD-10-CM

## 2017-02-23 DIAGNOSIS — I77810 Thoracic aortic ectasia: Secondary | ICD-10-CM | POA: Diagnosis not present

## 2017-02-23 DIAGNOSIS — R61 Generalized hyperhidrosis: Secondary | ICD-10-CM | POA: Diagnosis not present

## 2017-02-23 DIAGNOSIS — E782 Mixed hyperlipidemia: Secondary | ICD-10-CM

## 2017-02-23 NOTE — Patient Instructions (Signed)
Medication Instructions:   No medication changes made  Labwork:  No new labs needed  Testing/Procedures:  No further testing at this time  Research CT coronary calcium score $150   I recommend watching educational videos on topics of interest to you at:       www.goemmi.com  Enter code: HEARTCARE    Follow-Up: It was a pleasure seeing you in the office today. Please call us if you have new issues that need to be addressed before your next appt.  [phone]  Your physician wants you to follow-up in:  As needed  If you need a refill on your cardiac medications before your next appointment, please call your pharmacy.

## 2017-03-08 DIAGNOSIS — J342 Deviated nasal septum: Secondary | ICD-10-CM | POA: Diagnosis not present

## 2017-03-08 DIAGNOSIS — J328 Other chronic sinusitis: Secondary | ICD-10-CM | POA: Diagnosis not present

## 2017-03-08 DIAGNOSIS — J301 Allergic rhinitis due to pollen: Secondary | ICD-10-CM | POA: Diagnosis not present

## 2017-07-12 ENCOUNTER — Telehealth: Payer: Self-pay | Admitting: Cardiovascular Disease

## 2017-07-12 DIAGNOSIS — Z8249 Family history of ischemic heart disease and other diseases of the circulatory system: Secondary | ICD-10-CM

## 2017-07-12 NOTE — Telephone Encounter (Signed)
Pt wife calling stating she was told by Dr Mariah Milling that patient and her could go and get the CT calcium score test done in [place] She is calling to see if we can advise on scheduling those for them  Please advise.

## 2017-07-12 NOTE — Telephone Encounter (Signed)
Spoke w/ pt's wife. Advised her that I am entering orders for pt to have CT Calcium Score, and per Dr. Mariah Milling, her as well. Provided her w/ # to scheduling in [place] office to set this up at their convenience.  Will call w/ results and f/u w/ Dr. Mariah Milling if needed.  She is appreciative of the call.

## 2017-07-15 ENCOUNTER — Inpatient Hospital Stay: Admission: RE | Admit: 2017-07-15 | Payer: 59 | Source: Ambulatory Visit

## 2017-07-22 ENCOUNTER — Ambulatory Visit (INDEPENDENT_AMBULATORY_CARE_PROVIDER_SITE_OTHER)
Admission: RE | Admit: 2017-07-22 | Discharge: 2017-07-22 | Disposition: A | Discharge status: Home / Self Care | Payer: Self-pay | Source: Ambulatory Visit | Attending: Cardiovascular Disease | Admitting: Cardiovascular Disease

## 2017-07-22 DIAGNOSIS — Z8249 Family history of ischemic heart disease and other diseases of the circulatory system: Secondary | ICD-10-CM

## 2017-08-04 ENCOUNTER — Ambulatory Visit (INDEPENDENT_AMBULATORY_CARE_PROVIDER_SITE_OTHER): Payer: 59 | Admitting: Family Medicine

## 2017-08-04 ENCOUNTER — Encounter: Payer: Self-pay | Admitting: Family Medicine

## 2017-08-04 VITALS — BP 129/88 | HR 81 | Temp 98.0°F | Wt 167.7 lb

## 2017-08-04 DIAGNOSIS — Z23 Encounter for immunization: Secondary | ICD-10-CM | POA: Diagnosis not present

## 2017-08-04 DIAGNOSIS — L03115 Cellulitis of right lower limb: Secondary | ICD-10-CM

## 2017-08-04 MED ORDER — SULFAMETHOXAZOLE-TRIMETHOPRIM 800-160 MG PO TABS: 1.0000 | ORAL_TABLET | Freq: Two times a day (BID) | ORAL | 0 refills | Status: DC

## 2017-08-04 NOTE — Progress Notes (Signed)
   BP 129/88   Pulse 81   Temp 98 F (36.7 C)   Wt 167 lb 11.2 oz (76.1 kg)   SpO2 98%   BMI 27.07 kg/m    Subjective:    Patient ID: Benjamin Boone, male    DOB: 07/24/1956, 61 y.o.   MRN: [id number]  HPI: Benjamin Boone is a 61 y.o. male  Chief Complaint  Patient presents with  . Wound Infection    pt states he has a red place on his right leg that came up 2 weeks ago. States he was not bit by anything and thinks he bumped his leg on something   Patient presents with right LE pain, swelling, redness, and drainage. Bumped his leg about 2 weeks ago. Noticed a bump forming with purulent material, pain and redness down right leg. Poked a hole at home and was able to drain a fair amount from the area a day or so ago. Some improvement since this. Denies fever, chills, weakness.   Relevant past medical, surgical, family and social history reviewed and updated as indicated. Interim medical history since our last visit reviewed. Allergies and medications reviewed and updated.  Review of Systems  Constitutional: Negative.   Eyes: Negative.   Respiratory: Negative.   Cardiovascular: Negative.   Gastrointestinal: Negative.   Musculoskeletal:       Right LE pain  Neurological: Negative.   Psychiatric/Behavioral: Negative.    Per HPI unless specifically indicated above     Objective:    BP 129/88   Pulse 81   Temp 98 F (36.7 C)   Wt 167 lb 11.2 oz (76.1 kg)   SpO2 98%   BMI 27.07 kg/m   Wt Readings from Last 3 Encounters:  08/04/17 167 lb 11.2 oz (76.1 kg)  02/23/17 165 lb (74.8 kg)  01/19/17 162 lb (73.5 kg)    Physical Exam  Constitutional: He is oriented to person, place, and time. He appears well-developed and well-nourished. No distress.  HENT:  Head: Atraumatic.  Eyes: Conjunctivae are normal.  Neck: Normal range of motion. Neck supple.  Cardiovascular: Normal rate.   Pulmonary/Chest: Effort normal and breath sounds normal. No respiratory  distress.  Musculoskeletal: Normal range of motion. He exhibits edema (trace edema RLE) and tenderness (RLE near wound).  Neurological: He is alert and oriented to person, place, and time.  Skin: Skin is warm and dry. There is erythema (RLE surrounding mass).  Psychiatric: He has a normal mood and affect. His behavior is normal.  Nursing note and vitals reviewed.     Assessment & Plan:   Problem List Items Addressed This Visit    None    Visit Diagnoses    Cellulitis of right lower extremity    -  Primary   Discussed addition of bactrim for cellulitis. Elevate leg, epsom salt soaks, antibiotic ointment. F/u if worsening or no improvement over next few days   Need for influenza vaccination       Relevant Orders   Flu Vaccine QUAD 36+ mos IM (Completed)       Follow up plan: Return if symptoms worsen or fail to improve.

## 2017-08-04 NOTE — Patient Instructions (Addendum)

## 2017-10-20 ENCOUNTER — Ambulatory Visit: Payer: 59 | Admitting: Family Medicine

## 2017-10-20 ENCOUNTER — Encounter: Payer: Self-pay | Admitting: Family Medicine

## 2017-10-20 VITALS — BP 121/81 | HR 69 | Temp 98.2°F | Wt 155.0 lb

## 2017-10-20 DIAGNOSIS — J329 Chronic sinusitis, unspecified: Secondary | ICD-10-CM | POA: Diagnosis not present

## 2017-10-20 MED ORDER — AMOXICILLIN-POT CLAVULANATE 875-125 MG PO TABS: 1.0000 | ORAL_TABLET | Freq: Two times a day (BID) | ORAL | 0 refills | Status: DC

## 2017-10-20 NOTE — Progress Notes (Signed)
   BP 121/81   Pulse 69   Temp 98.2 F (36.8 C) (Oral)   Wt 155 lb (70.3 kg)   SpO2 97%   BMI 25.02 kg/m    Subjective:    Patient ID: Benjamin Boone, male    DOB: 02/20/1956, 61 y.o.   MRN: [id number]  HPI: Benjamin Boone is a 61 y.o. male  Chief Complaint  Patient presents with  . Sinusitis    3 weeks    Congestion, HA, facial pain and pressure, ear pain b/l x 3 weeks. Thought he was improving initially but sxs much worse the past 4 days or so.Taking mucinex with no relief. Lots of sick contacts lately. Long hx of recurrent sinusitis.   Relevant past medical, surgical, family and social history reviewed and updated as indicated. Interim medical history since our last visit reviewed. Allergies and medications reviewed and updated.  Review of Systems  Constitutional: Negative.   HENT: Positive for congestion, ear pain, sinus pressure and sinus pain.   Respiratory: Negative.   Cardiovascular: Negative.   Gastrointestinal: Negative.   Musculoskeletal: Negative.   Neurological: Positive for headaches.  Psychiatric/Behavioral: Negative.    Per HPI unless specifically indicated above     Objective:    BP 121/81   Pulse 69   Temp 98.2 F (36.8 C) (Oral)   Wt 155 lb (70.3 kg)   SpO2 97%   BMI 25.02 kg/m   Wt Readings from Last 3 Encounters:  10/20/17 155 lb (70.3 kg)  08/04/17 167 lb 11.2 oz (76.1 kg)  02/23/17 165 lb (74.8 kg)    Physical Exam  Constitutional: He is oriented to person, place, and time. He appears well-developed and well-nourished.  HENT:  Head: Atraumatic.  B/l middle ear effusion Oropharynx erythematous and mildly edematous B/l sinuses ttp  Eyes: Conjunctivae are normal. No scleral icterus.  Neck: Normal range of motion. Neck supple.  Musculoskeletal: Normal range of motion.  Lymphadenopathy:    He has no cervical adenopathy.  Neurological: He is alert and oriented to person, place, and time. A cranial nerve deficit is  present.  Skin: Skin is warm and dry.  Psychiatric: He has a normal mood and affect. His behavior is normal.  Nursing note and vitals reviewed.     Assessment & Plan:   Problem List Items Addressed This Visit      Respiratory   Recurrent sinusitis - Primary    Will treat with augmentin and flonase. Discussed humidifier, plain mucinex, sinus rinses prn. F/u if worsening or no improvment      Relevant Medications   amoxicillin-clavulanate (AUGMENTIN) 875-125 MG tablet       Follow up plan: Return if symptoms worsen or fail to improve.

## 2017-10-21 NOTE — Patient Instructions (Signed)
Follow up as needed

## 2017-10-21 NOTE — Assessment & Plan Note (Signed)
Will treat with augmentin and flonase. Discussed humidifier, plain mucinex, sinus rinses prn. F/u if worsening or no improvment

## 2017-10-28 ENCOUNTER — Other Ambulatory Visit: Payer: Self-pay | Admitting: Family Medicine

## 2017-10-29 MED ORDER — AMOXICILLIN-POT CLAVULANATE 875-125 MG PO TABS: 1.0000 | ORAL_TABLET | Freq: Two times a day (BID) | ORAL | 0 refills | Status: DC

## 2017-11-01 ENCOUNTER — Encounter: Payer: 59 | Admitting: Unknown Physician Specialty

## 2017-12-21 ENCOUNTER — Encounter: Payer: Self-pay | Admitting: Unknown Physician Specialty

## 2017-12-21 ENCOUNTER — Ambulatory Visit (INDEPENDENT_AMBULATORY_CARE_PROVIDER_SITE_OTHER): Payer: 59 | Admitting: Unknown Physician Specialty

## 2017-12-21 VITALS — BP 123/88 | HR 71 | Temp 98.0°F | Ht 64.8 in | Wt 169.6 lb

## 2017-12-21 DIAGNOSIS — Z0000 Encounter for general adult medical examination without abnormal findings: Secondary | ICD-10-CM

## 2017-12-21 NOTE — Progress Notes (Signed)
BP 123/88   Pulse 71   Temp 98 F (36.7 C) (Oral)   Ht 5' 4.8" (1.646 m)   Wt 169 lb 9.6 oz (76.9 kg)   SpO2 96%   BMI 28.40 kg/m    Subjective:    Patient ID: Benjamin Boone, male    DOB: 03/15/1956, 62 y.o.   MRN: [id number]  HPI: Benjamin CedarStephen Edward Koenigsberg is a 62 y.o. male  Chief Complaint  Patient presents with  . Annual Exam   Past Medical History:  Diagnosis Date  . Allergy   . Arthritis   . Hyperlipidemia   . Venous insufficiency    Social History   Socioeconomic History  . Marital status: Married    Spouse name: Not on file  . Number of children: Not on file  . Years of education: Not on file  . Highest education level: Not on file  Social Needs  . Financial resource strain: Not on file  . Food insecurity - worry: Not on file  . Food insecurity - inability: Not on file  . Transportation needs - medical: Not on file  . Transportation needs - non-medical: Not on file  Occupational History  . Not on file  Tobacco Use  . Smoking status: Never Smoker  . Smokeless tobacco: Never Used  Substance and Sexual Activity  . Alcohol use: No  . Drug use: No  . Sexual activity: Yes  Other Topics Concern  . Not on file  Social History Narrative  . Not on file   Family History  Problem Relation Age of Onset  . Heart disease Father        MI  . Heart disease Mother        pacemaker  . Heart attack Maternal Uncle    Past Surgical History:  Procedure Laterality Date  . TONSILLECTOMY    . TONSILLECTOMY      Relevant past medical, surgical, family and social history reviewed and updated as indicated. Interim medical history since our last visit reviewed. Allergies and medications reviewed and updated.  Review of Systems  Constitutional: Negative.   HENT:       Sinuses doing well at this time.  History of persistent problems  Eyes: Negative.   Respiratory: Negative.   Cardiovascular: Negative.   Gastrointestinal: Negative.   Endocrine:  Negative.   Genitourinary: Negative.   Musculoskeletal: Negative.   Skin: Negative.   Allergic/Immunologic: Negative.   Neurological: Negative.   Hematological: Negative.   Psychiatric/Behavioral: Negative.     Per HPI unless specifically indicated above     Objective:    BP 123/88   Pulse 71   Temp 98 F (36.7 C) (Oral)   Ht 5' 4.8" (1.646 m)   Wt 169 lb 9.6 oz (76.9 kg)   SpO2 96%   BMI 28.40 kg/m   Wt Readings from Last 3 Encounters:  12/21/17 169 lb 9.6 oz (76.9 kg)  10/20/17 155 lb (70.3 kg)  08/04/17 167 lb 11.2 oz (76.1 kg)    Physical Exam  Constitutional: He is oriented to person, place, and time. He appears well-developed and well-nourished.  HENT:  Head: Normocephalic.  Right Ear: Tympanic membrane, external ear and ear canal normal.  Left Ear: Tympanic membrane, external ear and ear canal normal.  Mouth/Throat: Uvula is midline, oropharynx is clear and moist and mucous membranes are normal.  Eyes: Pupils are equal, round, and reactive to light.  Cardiovascular: Normal rate, regular rhythm and normal heart sounds. Exam  reveals no gallop and no friction rub.  No murmur heard. Pulmonary/Chest: Effort normal and breath sounds normal. No respiratory distress.  Abdominal: Soft. Bowel sounds are normal. He exhibits no distension. There is no tenderness.  Musculoskeletal: Normal range of motion.  Neurological: He is alert and oriented to person, place, and time. He has normal reflexes.  Skin: Skin is warm and dry.  Psychiatric: He has a normal mood and affect. His behavior is normal. Judgment and thought content normal.      Assessment & Plan:   Problem List Items Addressed This Visit    None    Visit Diagnoses    Annual physical exam    -  Primary   Relevant Orders   CBC with Differential/Platelet   Comprehensive metabolic panel   Lipid Panel w/o Chol/HDL Ratio   TSH   PSA      HM: Colonoscopy due 2021 Td due 2024  Follow up plan: Return in  about 1 year (around 12/21/2018).

## 2017-12-22 ENCOUNTER — Telehealth: Payer: Self-pay | Admitting: Unknown Physician Specialty

## 2017-12-22 LAB — COMPREHENSIVE METABOLIC PANEL
ALT: 42 IU/L (ref 0–44)
AST: 38 IU/L (ref 0–40)
Albumin/Globulin Ratio: 1.5 (ref 1.2–2.2)
Albumin: 4.4 g/dL (ref 3.5–5.5)
Alkaline Phosphatase: 86 IU/L (ref 39–117)
BUN/Creatinine Ratio: 11 (ref 9–20)
BUN: 13 mg/dL (ref 6–24)
Bilirubin Total: 0.3 mg/dL (ref 0.0–1.2)
CALCIUM: 9.6 mg/dL (ref 8.7–10.2)
CO2: 19 mmol/L — AB (ref 20–29)
CREATININE: 1.2 mg/dL (ref 0.76–1.27)
Chloride: 103 mmol/L (ref 96–106)
GFR, EST AFRICAN AMERICAN: 76 mL/min/{1.73_m2} (ref >59)
GFR, EST NON AFRICAN AMERICAN: 66 mL/min/{1.73_m2} (ref >59)
GLUCOSE: 87 mg/dL (ref 65–99)
Globulin, Total: 3 g/dL (ref 1.5–4.5)
Potassium: 4.4 mmol/L (ref 3.5–5.2)
Sodium: 142 mmol/L (ref 134–144)
TOTAL PROTEIN: 7.4 g/dL (ref 6.0–8.5)

## 2017-12-22 LAB — CBC WITH DIFFERENTIAL/PLATELET
BASOS: 1 %
Basophils Absolute: 0.1 10*3/uL (ref 0.0–0.2)
EOS (ABSOLUTE): 0.6 10*3/uL — AB (ref 0.0–0.4)
Eos: 9 %
Hematocrit: 44.8 % (ref 37.5–51.0)
Hemoglobin: 15.4 g/dL (ref 13.0–17.7)
IMMATURE GRANS (ABS): 0 10*3/uL (ref 0.0–0.1)
IMMATURE GRANULOCYTES: 0 %
LYMPHS: 30 %
Lymphocytes Absolute: 2 10*3/uL (ref 0.7–3.1)
MCH: 28.8 pg (ref 26.6–33.0)
MCHC: 34.4 g/dL (ref 31.5–35.7)
MCV: 84 fL (ref 79–97)
Monocytes Absolute: 0.6 10*3/uL (ref 0.1–0.9)
Monocytes: 9 %
NEUTROS PCT: 51 %
Neutrophils Absolute: 3.4 10*3/uL (ref 1.4–7.0)
PLATELETS: 295 10*3/uL (ref 150–379)
RBC: 5.34 x10E6/uL (ref 4.14–5.80)
RDW: 14.5 % (ref 12.3–15.4)
WBC: 6.7 10*3/uL (ref 3.4–10.8)

## 2017-12-22 LAB — PSA: PROSTATE SPECIFIC AG, SERUM: 1 ng/mL (ref 0.0–4.0)

## 2017-12-22 LAB — LIPID PANEL W/O CHOL/HDL RATIO
Cholesterol, Total: 256 mg/dL — ABNORMAL HIGH (ref 100–199)
HDL: 33 mg/dL — AB (ref >39)
Triglycerides: 471 mg/dL — ABNORMAL HIGH (ref 0–149)

## 2017-12-22 LAB — TSH: TSH: 3.48 u[IU]/mL (ref 0.450–4.500)

## 2017-12-22 MED ORDER — GEMFIBROZIL 600 MG PO TABS: 600.0000 mg | ORAL_TABLET | Freq: Two times a day (BID) | ORAL | 1 refills | Status: DC

## 2017-12-22 NOTE — Telephone Encounter (Signed)
Please call and schedule the patient a 3 month f/up per Elnita Maxwellheryl. Thanks.

## 2017-12-22 NOTE — Progress Notes (Signed)
Notified pt by [employer] and phone

## 2017-12-22 NOTE — Telephone Encounter (Signed)
High Triglycerides.  Rare alcohol use. Prudent use of sugar.  Will start Gemfibrizole BID

## 2018-02-01 ENCOUNTER — Ambulatory Visit: Payer: Self-pay

## 2018-02-01 ENCOUNTER — Ambulatory Visit: Payer: 59 | Admitting: Family Medicine

## 2018-02-01 ENCOUNTER — Encounter: Payer: Self-pay | Admitting: Family Medicine

## 2018-02-01 VITALS — BP 118/83 | HR 106 | Temp 99.6°F | Wt 167.0 lb

## 2018-02-01 DIAGNOSIS — L4050 Arthropathic psoriasis, unspecified: Secondary | ICD-10-CM | POA: Diagnosis not present

## 2018-02-01 DIAGNOSIS — R52 Pain, unspecified: Secondary | ICD-10-CM

## 2018-02-01 DIAGNOSIS — R6889 Other general symptoms and signs: Secondary | ICD-10-CM

## 2018-02-01 LAB — VERITOR FLU A/B WAIVED
INFLUENZA B: NEGATIVE
Influenza A: NEGATIVE

## 2018-02-01 MED ORDER — OSELTAMIVIR PHOSPHATE 75 MG PO CAPS: 75.0000 mg | ORAL_CAPSULE | Freq: Two times a day (BID) | ORAL | 0 refills | Status: DC

## 2018-02-01 MED ORDER — PREDNISONE 50 MG PO TABS: 50.0000 mg | ORAL_TABLET | Freq: Every day | ORAL | 0 refills | Status: DC

## 2018-02-01 NOTE — Telephone Encounter (Signed)
Patient called in with c/o "flu-like symptoms." He says "today I left work with a fever 101.9. My co-worker was diagnosed with the flu today and he worked yesterday and had a fever. I'm having chills, body aches, headache. I took Ibuprofen for the fever, but I still have the body aches." I asked about high risk complications, he denies. According to protocol, call PCP within 24 hours.  I advised this would be sent to the provider for review. I asked what pharmacy he uses in case some Tamiflu is called in, he says  [facility] [phone]#[id number] [facility]- [place], [place] - [address] [phone] (Phone) [phone] (Fax)  Care advice give, patient verbalized understanding. I advised he would receive a call with the provider recommendation. I called the [facility] and [facility] because the provider is not in the office today. She asked Dr. Laural BenesJohnson and she wants to see the patient today. I called the patient to let him know his provider is out of the office this week and that Dr. Laural BenesJohnson wants to see him today at 1515 to evaluate his symptoms, he agreed to the appointment. I notified Christan via skype the patient agreed to the appointment. Appointment will be made by Christan due to it is a work in and I am blocked from making the appointment.    Reason for Disposition . [1] Patient is NOT HIGH RISK AND [2] strongly requests antiviral medicine AND [3] flu symptoms present < 48 hours  Answer Assessment - Initial Assessment Questions 1. TYPE of EXPOSURE: "How were you exposed?" (e.g., close contact, not a close contact)     Close contact with co-worker 2. DATE of EXPOSURE: "When did the exposure occur?" (e.g., hour, days, weeks)     Yesterday my co-worker was sick and diagnosed today 3. PREGNANCY: "Is there any chance you are pregnant?" "When was your last menstrual period?"     N/A 4. HIGH RISK for COMPLICATIONS: "Do you have any heart or lung problems? Do you have a weakened immune system?" (e.g., CHF, COPD,  asthma, HIV positive, chemotherapy, renal failure, diabetes mellitus, sickle cell anemia)     No 5. SYMPTOMS: "Do you have any symptoms?" (e.g., cough, fever, sore throat, difficulty breathing).     Fever, body aches, chills, headache  Answer Assessment - Initial Assessment Questions 1. WORST SYMPTOM: "What is your worst symptom?" (e.g., cough, runny nose, muscle aches, headache, sore throat, fever)      Fever, body aches, headache 2. ONSET: "When did your flu symptoms start?"      Today 3. COUGH: "How bad is the cough?"       N/A 4. RESPIRATORY DISTRESS: "Describe your breathing."      Breathing fine 5. FEVER: "Do you have a fever?" If so, ask: "What is your temperature, how was it measured, and when did it start?"    Yes, 101.9 about 1030 this morning 6. EXPOSURE: "Were you exposed to someone with influenza?"       Yes, my co-worker was diagnosed today; was working sick yesterday 7. FLU VACCINE: "Did you get a flu shot this year?"     Yes 8. HIGH RISK DISEASE: "Do you any chronic medical problems?" (e.g., heart or lung disease, asthma, weak immune system, or other HIGH RISK conditions)     No 9. PREGNANCY: "Is there any chance you are pregnant?" "When was your last menstrual period?"     N/A 10. OTHER SYMPTOMS: "Do you have any other symptoms?"  (e.g.,  runny nose, muscle aches, headache, sore throat)      Headache, body aches, sinus drainage (a little)  Protocols used: INFLUENZA - SEASONAL-A-AH, INFLUENZA EXPOSURE-A-AH

## 2018-02-01 NOTE — Progress Notes (Signed)
BP 118/83   Pulse (!) 106   Temp 99.6 F (37.6 C) (Oral)   Wt 167 lb (75.8 kg)   SpO2 98%   BMI 27.96 kg/m    Subjective:    Patient ID: Benjamin Boone, male    DOB: [birth year], 62 y.o.   MRN: [id number]  HPI: Benjamin Boone is a 62 y.o. male  Chief Complaint  Patient presents with  . URI    pt states he has had body aches, chills, cough, headache, and fever since this morning.    UPPER RESPIRATORY TRACT INFECTION Duration: This AM Worst symptom: body aches Fever: yes Cough: yes Shortness of breath: no Wheezing: yes Chest pain: no Chest tightness: yes Chest congestion: yes Nasal congestion: yes Runny nose: yes Post nasal drip: yes Sneezing: yes Sore throat: yes Swollen glands: no Sinus pressure: yes Headache: yes Face pain: yes Toothache: no Ear pain: no  Ear pressure: no  Eyes red/itching:yes Eye drainage/crusting: no  Vomiting: no Rash: no Fatigue: yes Sick contacts: yes Strep contacts: no  Context: worse Recurrent sinusitis: no Relief with OTC cold/cough medications: no  Treatments attempted: tylenol   Has been having some swelling in his L hand. Has been going on for about 4-5 days. In the first 2 knuckles and the 5th DIP joint. Hasn't had issues with his psoriatic arthritis in years, so hasn't been seeing anyone.   Relevant past medical, surgical, family and social history reviewed and updated as indicated. Interim medical history since our last visit reviewed. Allergies and medications reviewed and updated.  Review of Systems  Constitutional: Positive for chills, diaphoresis, fatigue and fever. Negative for activity change, appetite change and unexpected weight change.  HENT: Positive for congestion, postnasal drip, rhinorrhea, sinus pressure, sinus pain and sneezing. Negative for dental problem, drooling, ear discharge, ear pain, facial swelling, hearing loss, mouth sores, nosebleeds, sore throat, tinnitus, trouble swallowing and  voice change.   Respiratory: Positive for cough and wheezing. Negative for apnea, choking, chest tightness, shortness of breath and stridor.   Cardiovascular: Negative.   Gastrointestinal: Negative.   Neurological: Negative.   Psychiatric/Behavioral: Negative.     Per HPI unless specifically indicated above     Objective:    BP 118/83   Pulse (!) 106   Temp 99.6 F (37.6 C) (Oral)   Wt 167 lb (75.8 kg)   SpO2 98%   BMI 27.96 kg/m   Wt Readings from Last 3 Encounters:  02/01/18 167 lb (75.8 kg)  12/21/17 169 lb 9.6 oz (76.9 kg)  10/20/17 155 lb (70.3 kg)    Physical Exam  Constitutional: He is oriented to person, place, and time. He appears well-developed and well-nourished. He appears ill. No distress.  HENT:  Head: Normocephalic and atraumatic.  Right Ear: Hearing, tympanic membrane, external ear and ear canal normal.  Left Ear: Hearing, tympanic membrane, external ear and ear canal normal.  Nose: Nose normal.  Mouth/Throat: Uvula is midline, oropharynx is clear and moist and mucous membranes are normal. No oropharyngeal exudate.  Eyes: Conjunctivae, EOM and lids are normal. Pupils are equal, round, and reactive to light. Right eye exhibits no discharge. Left eye exhibits no discharge. No scleral icterus.  Neck: Normal range of motion. Neck supple. No JVD present. No tracheal deviation present. No thyromegaly present.  Cardiovascular: Normal rate, regular rhythm, normal heart sounds and intact distal pulses. Exam reveals no gallop and no friction rub.  No murmur heard. Pulmonary/Chest: Effort normal and breath sounds normal.  No stridor. No respiratory distress. He has no wheezes. He has no rales. He exhibits no tenderness.  Musculoskeletal: Normal range of motion.  Lymphadenopathy:    He has no cervical adenopathy.  Neurological: He is alert and oriented to person, place, and time.  Skin: Skin is warm, dry and intact. No rash noted. He is not diaphoretic. No erythema. No  pallor.  Psychiatric: He has a normal mood and affect. His speech is normal and behavior is normal. Judgment and thought content normal. Cognition and memory are normal.    Results for orders placed or performed in visit on 12/21/17  CBC with Differential/Platelet  Result Value Ref Range   WBC 6.7 3.4 - 10.8 x10E3/uL   RBC 5.34 4.14 - 5.80 x10E6/uL   Hemoglobin 15.4 13.0 - 17.7 g/dL   Hematocrit 81.1 91.4 - 51.0 %   MCV 84 79 - 97 fL   MCH 28.8 26.6 - 33.0 pg   MCHC 34.4 31.5 - 35.7 g/dL   RDW 78.2 95.6 - 21.3 %   Platelets 295 150 - 379 x10E3/uL   Neutrophils 51 Not Estab. %   Lymphs 30 Not Estab. %   Monocytes 9 Not Estab. %   Eos 9 Not Estab. %   Basos 1 Not Estab. %   Neutrophils Absolute 3.4 1.4 - 7.0 x10E3/uL   Lymphocytes Absolute 2.0 0.7 - 3.1 x10E3/uL   Monocytes Absolute 0.6 0.1 - 0.9 x10E3/uL   EOS (ABSOLUTE) 0.6 (H) 0.0 - 0.4 x10E3/uL   Basophils Absolute 0.1 0.0 - 0.2 x10E3/uL   Immature Granulocytes 0 Not Estab. %   Immature Grans (Abs) 0.0 0.0 - 0.1 x10E3/uL  Comprehensive metabolic panel  Result Value Ref Range   Glucose 87 65 - 99 mg/dL   BUN 13 6 - 24 mg/dL   Creatinine, Ser 0.86 0.76 - 1.27 mg/dL   GFR calc non Af Amer 66 >59 mL/min/1.73   GFR calc Af Amer 76 >59 mL/min/1.73   BUN/Creatinine Ratio 11 9 - 20   Sodium 142 134 - 144 mmol/L   Potassium 4.4 3.5 - 5.2 mmol/L   Chloride 103 96 - 106 mmol/L   CO2 19 (L) 20 - 29 mmol/L   Calcium 9.6 8.7 - 10.2 mg/dL   Total Protein 7.4 6.0 - 8.5 g/dL   Albumin 4.4 3.5 - 5.5 g/dL   Globulin, Total 3.0 1.5 - 4.5 g/dL   Albumin/Globulin Ratio 1.5 1.2 - 2.2   Bilirubin Total 0.3 0.0 - 1.2 mg/dL   Alkaline Phosphatase 86 39 - 117 IU/L   AST 38 0 - 40 IU/L   ALT 42 0 - 44 IU/L  Lipid Panel w/o Chol/HDL Ratio  Result Value Ref Range   Cholesterol, Total 256 (H) 100 - 199 mg/dL   Triglycerides 578 (H) 0 - 149 mg/dL   HDL 33 (L) >46 mg/dL   VLDL Cholesterol Cal Comment 5 - 40 mg/dL   LDL Calculated Comment 0 -  99 mg/dL  TSH  Result Value Ref Range   TSH 3.480 0.450 - 4.500 uIU/mL  PSA  Result Value Ref Range   Prostate Specific Ag, Serum 1.0 0.0 - 4.0 ng/mL      Assessment & Plan:   Problem List Items Addressed This Visit      Musculoskeletal and Integument   Psoriatic arthritis (HCC)    Will treat with prednisone- call if not improving. To take after flu treated.        Other Visit Diagnoses  Flu-like symptoms    -  Primary   Flu negative, but strong suspicion. Will treat. Tamiflu sent to his pharmacy.   Body aches       Flu negative, but strong suspicion. Will treat.    Relevant Orders   Veritor Flu A/B Waived       Follow up plan: Return if symptoms worsen or fail to improve.

## 2018-02-01 NOTE — Assessment & Plan Note (Signed)
Will treat with prednisone- call if not improving. To take after flu treated.

## 2018-02-01 NOTE — Telephone Encounter (Signed)
Appointment today

## 2018-02-17 ENCOUNTER — Ambulatory Visit: Payer: 59 | Admitting: Family Medicine

## 2018-02-17 ENCOUNTER — Encounter: Payer: Self-pay | Admitting: Family Medicine

## 2018-02-17 VITALS — BP 116/78 | HR 72 | Temp 97.9°F | Ht 64.8 in | Wt 166.1 lb

## 2018-02-17 DIAGNOSIS — L4050 Arthropathic psoriasis, unspecified: Secondary | ICD-10-CM

## 2018-02-17 DIAGNOSIS — M20002 Unspecified deformity of left finger(s): Secondary | ICD-10-CM

## 2018-02-17 NOTE — Progress Notes (Signed)
   BP 116/78   Pulse 72   Temp 97.9 F (36.6 C) (Oral)   Ht 5' 4.8" (1.646 m)   Wt 166 lb 1.6 oz (75.3 kg)   SpO2 97%   BMI 27.81 kg/m    Subjective:    Patient ID: Benjamin Boone, male    DOB: 08/23/1956, 62 y.o.   MRN: [id number]  HPI: Benjamin CedarStephen Edward Schloemer is a 62 y.o. male  Chief Complaint  Patient presents with  . Hand Problem    pt states his left hand is still swollen, states he thinks it is getting a little better though   Pt here today for persistent left hand swelling, stiffness, and pain. Taking tylenol prn, very rarely. Recently did a prednisone course which did help some. Hx of psoriatic arthritis. Most bothersome to him is the deformity in his left 5th digit that has come over the past year or so. States he's left hand dominant and this gets in his way quite a bit.   Relevant past medical, surgical, family and social history reviewed and updated as indicated. Interim medical history since our last visit reviewed. Allergies and medications reviewed and updated.  Review of Systems  Per HPI unless specifically indicated above     Objective:    BP 116/78   Pulse 72   Temp 97.9 F (36.6 C) (Oral)   Ht 5' 4.8" (1.646 m)   Wt 166 lb 1.6 oz (75.3 kg)   SpO2 97%   BMI 27.81 kg/m   Wt Readings from Last 3 Encounters:  02/17/18 166 lb 1.6 oz (75.3 kg)  02/01/18 167 lb (75.8 kg)  12/21/17 169 lb 9.6 oz (76.9 kg)    Physical Exam  Constitutional: He is oriented to person, place, and time. He appears well-developed and well-nourished. No distress.  HENT:  Head: Atraumatic.  Eyes: Pupils are equal, round, and reactive to light. Conjunctivae are normal. No scleral icterus.  Neck: Normal range of motion. Neck supple.  Cardiovascular: Normal rate and normal heart sounds.  Pulmonary/Chest: Effort normal and breath sounds normal.  Musculoskeletal: He exhibits edema (diffuse edema left hand) and deformity (left 5th digit at PIP).  Neurological: He is alert  and oriented to person, place, and time.  Skin: Skin is warm and dry. No erythema.  Psychiatric: He has a normal mood and affect. His behavior is normal.  Nursing note and vitals reviewed.   Results for orders placed or performed in visit on 02/01/18  Veritor Flu A/B Waived  Result Value Ref Range   Influenza A Negative Negative   Influenza B Negative Negative      Assessment & Plan:   Problem List Items Addressed This Visit      Musculoskeletal and Integument   Psoriatic arthritis (HCC) - Primary    Continue tylenol, epsom salt soaks, exercises prn.        Other Visit Diagnoses    Finger deformity, left       Will refer to orthopedics for tx of finger deformity per pt request   Relevant Orders   AMB referral to orthopedics       Follow up plan: Return if symptoms worsen or fail to improve.

## 2018-02-20 NOTE — Assessment & Plan Note (Signed)
Continue tylenol, epsom salt soaks, exercises prn.

## 2018-02-20 NOTE — Patient Instructions (Signed)
Follow up as needed

## 2018-02-24 DIAGNOSIS — M20012 Mallet finger of left finger(s): Secondary | ICD-10-CM | POA: Diagnosis not present

## 2018-03-21 ENCOUNTER — Encounter: Payer: Self-pay | Admitting: Unknown Physician Specialty

## 2018-03-21 ENCOUNTER — Ambulatory Visit: Payer: 59 | Admitting: Unknown Physician Specialty

## 2018-03-21 VITALS — BP 130/87 | HR 75 | Ht 64.8 in | Wt 159.4 lb

## 2018-03-21 DIAGNOSIS — S20211D Contusion of right front wall of thorax, subsequent encounter: Secondary | ICD-10-CM

## 2018-03-21 DIAGNOSIS — E782 Mixed hyperlipidemia: Secondary | ICD-10-CM

## 2018-03-21 NOTE — Progress Notes (Signed)
   BP 130/87   Pulse 75   Ht 5' 4.8" (1.646 m)   Wt 159 lb 6.4 oz (72.3 kg)   SpO2 94%   BMI 26.69 kg/m    Subjective:    Patient ID: Benjamin Boone, male    DOB: [birth year], 62 y.o.   MRN: [id number]  HPI: Benjamin Boone is a 62 y.o. male  Chief Complaint  Patient presents with  . Hyperlipidemia    3 month f/up   F/U Triglycerides.  Started Gemfibrozole last visit.  Tolerating medication OK. Does not drink ETOH.  Cutting back on sugar and soft drinks.  Fasting today  Broken rib Larey Seat in February.  Still having some rib pain on his back. He does see a gradual trend of improvement.  Does admit to staying busy.  Reviewed note from [facility] and diagnosed with contusion.  X-ray was normal  Relevant past medical, surgical, family and social history reviewed and updated as indicated. Interim medical history since our last visit reviewed. Allergies and medications reviewed and updated.  Review of Systems  Constitutional: Negative.   HENT: Negative.   Respiratory: Negative.   Cardiovascular: Negative.   Gastrointestinal: Negative.   Musculoskeletal: Negative.   Psychiatric/Behavioral: Negative.     Per HPI unless specifically indicated above     Objective:    BP 130/87   Pulse 75   Ht 5' 4.8" (1.646 m)   Wt 159 lb 6.4 oz (72.3 kg)   SpO2 94%   BMI 26.69 kg/m   Wt Readings from Last 3 Encounters:  03/21/18 159 lb 6.4 oz (72.3 kg)  02/17/18 166 lb 1.6 oz (75.3 kg)  02/01/18 167 lb (75.8 kg)    Physical Exam  Constitutional: He is oriented to person, place, and time. He appears well-developed and well-nourished. No distress.  HENT:  Head: Normocephalic and atraumatic.  Eyes: Conjunctivae and lids are normal. Right eye exhibits no discharge. Left eye exhibits no discharge. No scleral icterus.  Neck: Normal range of motion. Neck supple. No JVD present. Carotid bruit is not present.  Cardiovascular: Normal rate, regular rhythm and normal heart sounds.    Pulmonary/Chest: Effort normal and breath sounds normal. No respiratory distress.  Abdominal: Normal appearance. There is no splenomegaly or hepatomegaly.  Musculoskeletal: Normal range of motion.  Neurological: He is alert and oriented to person, place, and time.  Skin: Skin is warm, dry and intact. No rash noted. No pallor.  Psychiatric: He has a normal mood and affect. His behavior is normal. Judgment and thought content normal.    Results for orders placed or performed in visit on 02/01/18  Veritor Flu A/B Waived  Result Value Ref Range   Influenza A Negative Negative   Influenza B Negative Negative      Assessment & Plan:   Problem List Items Addressed This Visit      Unprioritized   Hyperlipidemia    Check lipid panel and Triglycerides to evaluate the effectiveness of Gemfibrozole      Relevant Orders   Lipid Panel w/o Chol/HDL Ratio    Other Visit Diagnoses    Contusion of rib on right side, subsequent encounter    -  Primary   New problem to me.  Will continue to monitor for the fnext 30 days.  If continues to bother chest x-ray and rib films, right side       Follow up plan: Return if symptoms worsen or fail to improve.

## 2018-03-21 NOTE — Assessment & Plan Note (Addendum)
Check lipid panel and Triglycerides to evaluate the effectiveness of Gemfibrozole

## 2018-03-22 ENCOUNTER — Encounter: Payer: Self-pay | Admitting: Unknown Physician Specialty

## 2018-03-22 LAB — LIPID PANEL W/O CHOL/HDL RATIO
CHOLESTEROL TOTAL: 183 mg/dL (ref 100–199)
HDL: 44 mg/dL (ref >39)
LDL Calculated: 121 mg/dL — ABNORMAL HIGH (ref 0–99)
Triglycerides: 92 mg/dL (ref 0–149)
VLDL CHOLESTEROL CAL: 18 mg/dL (ref 5–40)

## 2018-04-21 DIAGNOSIS — M20012 Mallet finger of left finger(s): Secondary | ICD-10-CM | POA: Diagnosis not present

## 2018-05-05 ENCOUNTER — Ambulatory Visit (INDEPENDENT_AMBULATORY_CARE_PROVIDER_SITE_OTHER): Payer: 59 | Admitting: Family Medicine

## 2018-05-05 ENCOUNTER — Ambulatory Visit: Payer: 59 | Admitting: Family Medicine

## 2018-05-05 ENCOUNTER — Encounter: Payer: Self-pay | Admitting: Family Medicine

## 2018-05-05 VITALS — BP 117/76 | HR 75 | Temp 97.9°F | Wt 163.5 lb

## 2018-05-05 DIAGNOSIS — J0100 Acute maxillary sinusitis, unspecified: Secondary | ICD-10-CM

## 2018-05-05 MED ORDER — AMOXICILLIN-POT CLAVULANATE 875-125 MG PO TABS: 1.0000 | ORAL_TABLET | Freq: Two times a day (BID) | ORAL | 0 refills | Status: DC

## 2018-05-05 NOTE — Progress Notes (Signed)
BP 117/76 (BP Location: Left Arm, Patient Position: Sitting, Cuff Size: Normal)   Pulse 75   Temp 97.9 F (36.6 C)   Wt 163 lb 8 oz (74.2 kg)   SpO2 97%   BMI 27.38 kg/m    Subjective:    Patient ID: Benjamin Boone, male    DOB: 10/08/1956, 62 y.o.   MRN: [id number]  HPI: Benjamin Boone is a 62 y.o. male  Chief Complaint  Patient presents with  . URI    X 2-3 weeks, nasal congestion and cough   UPPER RESPIRATORY TRACT INFECTION Duration: 2-3 weeks Worst symptom: congestion Fever: no Cough: yes Shortness of breath: no Wheezing: yes Chest pain: no Chest tightness: no Chest congestion: yes Nasal congestion: yes Runny nose: yes Post nasal drip: yes Sneezing: yes Sore throat: yes Swollen glands: no Sinus pressure: yes Headache: yes Face pain: yes Toothache: no Ear pain: no  Ear pressure: yes "right Eyes red/itching:no Eye drainage/crusting: no  Vomiting: no Rash: no Fatigue: yes Sick contacts: no Strep contacts: no  Context: fluctuating Recurrent sinusitis: no Relief with OTC cold/cough medications: no  Treatments attempted: cold/sinus   Relevant past medical, surgical, family and social history reviewed and updated as indicated. Interim medical history since our last visit reviewed. Allergies and medications reviewed and updated.  Review of Systems  Constitutional: Positive for fatigue. Negative for activity change, appetite change, chills, diaphoresis, fever and unexpected weight change.  HENT: Positive for congestion, postnasal drip, rhinorrhea and sinus pressure. Negative for dental problem, drooling, ear discharge, ear pain, facial swelling, hearing loss, mouth sores, nosebleeds, sinus pain, sneezing, sore throat, tinnitus, trouble swallowing and voice change.   Eyes: Negative.   Respiratory: Negative.   Cardiovascular: Negative.   Gastrointestinal: Negative.   Psychiatric/Behavioral: Negative.     Per HPI unless specifically  indicated above     Objective:    BP 117/76 (BP Location: Left Arm, Patient Position: Sitting, Cuff Size: Normal)   Pulse 75   Temp 97.9 F (36.6 C)   Wt 163 lb 8 oz (74.2 kg)   SpO2 97%   BMI 27.38 kg/m   Wt Readings from Last 3 Encounters:  05/05/18 163 lb 8 oz (74.2 kg)  03/21/18 159 lb 6.4 oz (72.3 kg)  02/17/18 166 lb 1.6 oz (75.3 kg)    Physical Exam  Constitutional: He is oriented to person, place, and time. He appears well-developed and well-nourished. No distress.  HENT:  Head: Normocephalic and atraumatic.  Right Ear: Hearing, tympanic membrane, external ear and ear canal normal.  Left Ear: Hearing, tympanic membrane, external ear and ear canal normal.  Nose: Mucosal edema and rhinorrhea present. Right sinus exhibits maxillary sinus tenderness. Right sinus exhibits no frontal sinus tenderness. Left sinus exhibits maxillary sinus tenderness. Left sinus exhibits no frontal sinus tenderness.  Mouth/Throat: Oropharynx is clear and moist. No oropharyngeal exudate.  Eyes: Pupils are equal, round, and reactive to light. Conjunctivae, EOM and lids are normal. Right eye exhibits no discharge. Left eye exhibits no discharge. No scleral icterus.  Neck: Normal range of motion. Neck supple. No JVD present. No tracheal deviation present. No thyromegaly present.  Cardiovascular: Normal rate, regular rhythm, normal heart sounds and intact distal pulses. Exam reveals no gallop and no friction rub.  No murmur heard. Pulmonary/Chest: Effort normal and breath sounds normal. No stridor. No respiratory distress. He has no wheezes. He has no rales. He exhibits no tenderness.  Musculoskeletal: Normal range of motion.  Lymphadenopathy:  He has no cervical adenopathy.  Neurological: He is alert and oriented to person, place, and time.  Skin: Skin is warm, dry and intact. Capillary refill takes less than 2 seconds. No rash noted. He is not diaphoretic. No erythema. No pallor.  Psychiatric: He  has a normal mood and affect. His speech is normal and behavior is normal. Judgment and thought content normal. Cognition and memory are normal.  Nursing note and vitals reviewed.   Results for orders placed or performed in visit on 03/21/18  Lipid Panel w/o Chol/HDL Ratio  Result Value Ref Range   Cholesterol, Total 183 100 - 199 mg/dL   Triglycerides 92 0 - 149 mg/dL   HDL 44 >16 mg/dL   VLDL Cholesterol Cal 18 5 - 40 mg/dL   LDL Calculated 109 (H) 0 - 99 mg/dL      Assessment & Plan:   Problem List Items Addressed This Visit    None    Visit Diagnoses    Acute non-recurrent maxillary sinusitis    -  Primary   Will treat with augmentin. Call with any concerns or if not getting better.    Relevant Medications   amoxicillin-clavulanate (AUGMENTIN) 875-125 MG tablet       Follow up plan: Return if symptoms worsen or fail to improve.

## 2018-05-12 ENCOUNTER — Telehealth: Payer: Self-pay | Admitting: Unknown Physician Specialty

## 2018-05-12 NOTE — Telephone Encounter (Signed)
Copied from CRM [id number]. Topic: Quick Communication - See Telephone Encounter >> May 12, 2018  2:01 PM Tamela OddiMartin, Don'Quashia, NT wrote: CRM for notification. See Telephone encounter for: 05/12/18. Patient called and states that he seen Dr. Laural BenesJohnson and she prescribed him amoxicillin-clavulanate (AUGMENTIN) 875-125 MG tablet . He only have 20 tablets. Patient states that he is not completely over this sinus infection and would like for Dr. Laural BenesJohnson to send him additional refills. Patient is going out of town and would like to have this medication before he goes. Patient is requesting a call back on the response of this request CB#[phone]  [id number] [place] - [address] [phone] (Fax)

## 2018-05-13 MED ORDER — AMOXICILLIN-POT CLAVULANATE 875-125 MG PO TABS: 1.0000 | ORAL_TABLET | Freq: Two times a day (BID) | ORAL | 0 refills | Status: DC

## 2018-05-13 NOTE — Telephone Encounter (Signed)
Another 4 days (2 weeks total) sent to his pharmacy. If not better with that, will need to be seen.

## 2018-05-13 NOTE — Telephone Encounter (Signed)
LOV  05/05/18 Dr. Lorin PicketScott Pt. Requesting more Augmentin - still having sinus symptoms.

## 2018-05-13 NOTE — Telephone Encounter (Signed)
Called and let the patient know that a medication was sent in for him. Advised him to be seen if not better per Dr. Laural BenesJohnson and patient verbalized understanding.

## 2018-05-24 DIAGNOSIS — M20012 Mallet finger of left finger(s): Secondary | ICD-10-CM | POA: Diagnosis not present

## 2018-06-07 DIAGNOSIS — J330 Polyp of nasal cavity: Secondary | ICD-10-CM | POA: Diagnosis not present

## 2018-06-07 DIAGNOSIS — J328 Other chronic sinusitis: Secondary | ICD-10-CM | POA: Diagnosis not present

## 2018-06-28 ENCOUNTER — Other Ambulatory Visit: Payer: Self-pay | Admitting: Unknown Physician Specialty

## 2018-06-28 MED ORDER — GEMFIBROZIL 600 MG PO TABS: 600.0000 mg | ORAL_TABLET | Freq: Two times a day (BID) | ORAL | 0 refills | Status: DC

## 2018-06-28 NOTE — Telephone Encounter (Signed)
Copied from CRM [id number]. Topic: Quick Communication - See Telephone Encounter >> Jun 28, 2018  4:24 PM Terisa Starraylor, Brittany L wrote: CRM for notification. See Telephone encounter for: 06/28/18.  gemfibrozil (LOPID) 600 MG tablet  [id number] [place], [place] - [address] [place] [id number]

## 2018-08-16 ENCOUNTER — Ambulatory Visit: Payer: 59 | Admitting: Family Medicine

## 2018-08-16 ENCOUNTER — Other Ambulatory Visit: Payer: Self-pay

## 2018-08-16 ENCOUNTER — Encounter

## 2018-08-16 ENCOUNTER — Encounter: Payer: Self-pay | Admitting: Family Medicine

## 2018-08-16 VITALS — BP 126/86 | HR 77 | Temp 97.3°F | Ht 64.8 in | Wt 172.0 lb

## 2018-08-16 DIAGNOSIS — R609 Edema, unspecified: Secondary | ICD-10-CM

## 2018-08-16 DIAGNOSIS — Z23 Encounter for immunization: Secondary | ICD-10-CM

## 2018-08-16 DIAGNOSIS — M25561 Pain in right knee: Secondary | ICD-10-CM | POA: Diagnosis not present

## 2018-08-16 MED ORDER — NAPROXEN 500 MG PO TABS: 500.0000 mg | ORAL_TABLET | Freq: Two times a day (BID) | ORAL | 1 refills | Status: DC

## 2018-08-16 NOTE — Progress Notes (Signed)
BP 126/86   Pulse 77   Temp (!) 97.3 F (36.3 C) (Tympanic)   Ht 5' 4.8" (1.646 m)   Wt 172 lb (78 kg)   SpO2 98%   BMI 28.80 kg/m    Subjective:    Patient ID: Benjamin Boone, male    DOB: 05/12/1956, 62 y.o.   MRN: [id number]  HPI: Benjamin Boone is a 62 y.o. male  Chief Complaint  Patient presents with  . Leg Swelling    right leg x 3 days  . Knee Pain    right side/ pt states have taken OTC advil and helped a bit   Went to the beach 3 days ago and was riding bikes. He noticed right after that that he started having some pain in his R knee on the medial side. Worse when he pushes on it. Better with rest. It's been getting better. He thinks he has some arthritis in the knee. Pain does not radiate. He also noticed that when he was driving home, it swelled from his knee down to his ankle. He was driving about 3 hours. He propped it up and it is not swelling any more. He is otherwise feeling well with no other concerns or complaints at this time.    Relevant past medical, surgical, family and social history reviewed and updated as indicated. Interim medical history since our last visit reviewed. Allergies and medications reviewed and updated.  Review of Systems  Constitutional: Negative.   Respiratory: Negative.   Cardiovascular: Negative.   Musculoskeletal: Positive for arthralgias and joint swelling. Negative for back pain, gait problem, myalgias, neck pain and neck stiffness.  Skin: Negative.   Psychiatric/Behavioral: Negative.     Per HPI unless specifically indicated above     Objective:    BP 126/86   Pulse 77   Temp (!) 97.3 F (36.3 C) (Tympanic)   Ht 5' 4.8" (1.646 m)   Wt 172 lb (78 kg)   SpO2 98%   BMI 28.80 kg/m   Wt Readings from Last 3 Encounters:  08/16/18 172 lb (78 kg)  05/05/18 163 lb 8 oz (74.2 kg)  03/21/18 159 lb 6.4 oz (72.3 kg)    Physical Exam  Constitutional: He is oriented to person, place, and time. He appears  well-developed and well-nourished. No distress.  HENT:  Head: Normocephalic and atraumatic.  Right Ear: Hearing normal.  Left Ear: Hearing normal.  Nose: Nose normal.  Eyes: Conjunctivae and lids are normal. Right eye exhibits no discharge. Left eye exhibits no discharge. No scleral icterus.  Cardiovascular: Normal rate, regular rhythm, normal heart sounds and intact distal pulses. Exam reveals no gallop and no friction rub.  No murmur heard. Pulmonary/Chest: Effort normal and breath sounds normal. No stridor. No respiratory distress. He has no wheezes. He has no rales. He exhibits no tenderness.  Musculoskeletal: Normal range of motion. He exhibits edema (trace edema on R leg, negativen homan's negative squeeze) and tenderness (over medial knee on the R, mild effusion of R knee). He exhibits no deformity.  Neurological: He is alert and oriented to person, place, and time.  Skin: Skin is warm, dry and intact. Capillary refill takes less than 2 seconds. No rash noted. He is not diaphoretic. No erythema. No pallor.  Psychiatric: He has a normal mood and affect. His speech is normal and behavior is normal. Judgment and thought content normal. Cognition and memory are normal.    Results for orders placed or performed in  visit on 03/21/18  Lipid Panel w/o Chol/HDL Ratio  Result Value Ref Range   Cholesterol, Total 183 100 - 199 mg/dL   Triglycerides 92 0 - 149 mg/dL   HDL 44 >78>39 mg/dL   VLDL Cholesterol Cal 18 5 - 40 mg/dL   LDL Calculated 295121 (H) 0 - 99 mg/dL      Assessment & Plan:   Problem List Items Addressed This Visit    None    Visit Diagnoses    Peripheral edema    -  Primary   Likely due to vascular insuffiency- but we will check d dimer, if + will get US to r/o DVT- if negative, start compression stockings. Continue with elevation.    Relevant Orders   D-Dimer, Quantitative   Flu vaccine need       Flu shot given today.   Relevant Orders   Flu Vaccine QUAD 36+ mos IM  (Completed)   Acute pain of right knee       Offered x-ray to look for arthritis, patient wants to hold off for now. Will start naproxen PRN. Call with any concerns.        Follow up plan: Return if symptoms worsen or fail to improve.

## 2018-08-17 ENCOUNTER — Ambulatory Visit
Admission: RE | Admit: 2018-08-17 | Discharge: 2018-08-17 | Disposition: A | Discharge status: Home / Self Care | Payer: 59 | Source: Ambulatory Visit | Attending: Family Medicine | Admitting: Family Medicine

## 2018-08-17 ENCOUNTER — Telehealth: Payer: Self-pay | Admitting: Family Medicine

## 2018-08-17 DIAGNOSIS — R609 Edema, unspecified: Secondary | ICD-10-CM

## 2018-08-17 DIAGNOSIS — R600 Localized edema: Secondary | ICD-10-CM | POA: Diagnosis not present

## 2018-08-17 DIAGNOSIS — R7989 Other specified abnormal findings of blood chemistry: Secondary | ICD-10-CM | POA: Insufficient documentation

## 2018-08-17 LAB — D-DIMER, QUANTITATIVE (NOT AT ARMC): D-DIMER: 0.55 mg{FEU}/L — AB (ref 0.00–0.49)

## 2018-08-17 NOTE — Telephone Encounter (Signed)
Message relayed to patient. Verbalized understanding and denied questions.   

## 2018-08-17 NOTE — Telephone Encounter (Signed)
Can we please call this patient and tell him his ultrasound came back negative for DVT? Thank you.

## 2018-08-17 NOTE — Telephone Encounter (Signed)
Going at 2:30- please watch out for results

## 2018-08-17 NOTE — Telephone Encounter (Signed)
Called patient to let him know that d-dimer was elevated. Will get him in for STAT US to r/o DVT- order placed. He's will go when they can see him.

## 2018-09-25 ENCOUNTER — Other Ambulatory Visit: Payer: Self-pay | Admitting: Physician Assistant

## 2018-09-26 NOTE — Telephone Encounter (Signed)
Refill request approved.  Needs f/u in upcoming months for lipid panel.

## 2018-09-27 ENCOUNTER — Encounter: Payer: Self-pay | Admitting: Family Medicine

## 2018-09-27 ENCOUNTER — Ambulatory Visit (INDEPENDENT_AMBULATORY_CARE_PROVIDER_SITE_OTHER): Payer: 59 | Admitting: Family Medicine

## 2018-09-27 VITALS — BP 109/74 | HR 77 | Temp 98.1°F | Ht 66.0 in | Wt 171.6 lb

## 2018-09-27 DIAGNOSIS — J069 Acute upper respiratory infection, unspecified: Secondary | ICD-10-CM

## 2018-09-27 DIAGNOSIS — B9789 Other viral agents as the cause of diseases classified elsewhere: Secondary | ICD-10-CM

## 2018-09-27 MED ORDER — PREDNISONE 10 MG PO TABS
ORAL_TABLET | ORAL | 0 refills | Status: DC
Start: 2018-09-27 — End: 2018-10-24

## 2018-09-27 MED ORDER — AMOXICILLIN-POT CLAVULANATE 875-125 MG PO TABS: 1.0000 | ORAL_TABLET | Freq: Two times a day (BID) | ORAL | 0 refills | Status: DC

## 2018-09-27 NOTE — Progress Notes (Signed)
   BP 109/74 (BP Location: Right Arm, Patient Position: Sitting, Cuff Size: Normal)   Pulse 77   Temp 98.1 F (36.7 C) (Oral)   Ht 5\' 6"  (1.676 m)   Wt 171 lb 9.6 oz (77.8 kg)   SpO2 97%   BMI 27.70 kg/m    Subjective:    Patient ID: Benjamin Boone, male    DOB: 1956/10/02, 62 y.o.   MRN: [id number]  HPI: Benjamin Boone is a 62 y.o. male  Chief Complaint  Patient presents with  . Sinusitis    Ongoing 3 days. Patient complains of a lot of congestion.   . Nasal Congestion  . Hoarse  . Cough  . Sore Throat   Here today with 4 days of congestion, fatigue/malaise, sinus pain and pressure, hoarseness, hacking cough, and sore throat. Denies fevers, chills, aches, CP, SOB. Not taking anything OTC currently. Lots of sick contacts at work. Hx of allergic rhinitis not currently on medications.   Relevant past medical, surgical, family and social history reviewed and updated as indicated. Interim medical history since our last visit reviewed. Allergies and medications reviewed and updated.  Review of Systems  Per HPI unless specifically indicated above     Objective:    BP 109/74 (BP Location: Right Arm, Patient Position: Sitting, Cuff Size: Normal)   Pulse 77   Temp 98.1 F (36.7 C) (Oral)   Ht 5\' 6"  (1.676 m)   Wt 171 lb 9.6 oz (77.8 kg)   SpO2 97%   BMI 27.70 kg/m   Wt Readings from Last 3 Encounters:  09/27/18 171 lb 9.6 oz (77.8 kg)  08/16/18 172 lb (78 kg)  05/05/18 163 lb 8 oz (74.2 kg)    Physical Exam  Constitutional: He is oriented to person, place, and time. He appears well-developed and well-nourished. No distress.  HENT:  Head: Atraumatic.  Right Ear: External ear normal.  Left Ear: External ear normal.  Mouth/Throat: No oropharyngeal exudate.  Nasal mucosa and oropharynx erythematous and edematous  Eyes: Conjunctivae and EOM are normal.  Neck: Normal range of motion. Neck supple.  Cardiovascular: Normal rate, regular rhythm and normal  heart sounds.  Pulmonary/Chest: Effort normal and breath sounds normal. No respiratory distress. He has no wheezes. He has no rales.  Musculoskeletal: Normal range of motion.  Lymphadenopathy:    He has no cervical adenopathy.  Neurological: He is alert and oriented to person, place, and time.  Skin: Skin is warm and dry.  Psychiatric: He has a normal mood and affect. His behavior is normal.  Nursing note and vitals reviewed.   Results for orders placed or performed in visit on 08/16/18  D-Dimer, Quantitative  Result Value Ref Range   D-DIMER 0.55 (H) 0.00 - 0.49 mg/L FEU      Assessment & Plan:   Problem List Items Addressed This Visit    None    Visit Diagnoses    Viral URI with cough    -  Primary   Tx with prednisone, flonase, mucinex, sinus rinses, humidifier. Augmentin sent in case not improving over the next 4-5 days. F/u if worsening       Follow up plan: Return if symptoms worsen or fail to improve.

## 2018-10-12 ENCOUNTER — Telehealth: Payer: Self-pay | Admitting: Nurse Practitioner

## 2018-10-12 ENCOUNTER — Telehealth: Payer: Self-pay | Admitting: Unknown Physician Specialty

## 2018-10-12 NOTE — Telephone Encounter (Signed)
Medication:amoxicillin-clavulanate (AUGMENTIN) 875-125 MG tablet  Has the patient contacted their pharmacy?yes  Preferred Pharmacy (with phone number or street name): [id number] - [place], [place] - [address] [phone] (Phone) [phone] (Fax)    Agent: Please be advised that RX refills may take up to 3 business days. We ask that you follow-up with your pharmacy.

## 2018-10-12 NOTE — Telephone Encounter (Signed)
Copied from CRM [id number]. Topic: Quick Communication - See Telephone Encounter >> Oct 12, 2018  6:28 PM Debroah LoopLander, Lumin L wrote: CRM for notification. See Telephone encounter for: 10/12/18. Patient is not feeling better after round of antibiotics given on 09/27/2018 and next available appt in on 10/24/2018. Please call patient to notify him if he can be work in sooner or if more antibiotics can be called in w/o an appointment. [facility].

## 2018-10-12 NOTE — Telephone Encounter (Signed)
Patient called, left VM to return call to the office to speak to a TN discuss reason why needing a refill on Augmentin.

## 2018-10-12 NOTE — Telephone Encounter (Signed)
Spoke with pt regarding request for antibiotic. Pt states he is still having a lot of drainage and his throat feels raw and he can't talk. Call dropped before symptoms discussed further.Left message for pt to return call to the office to schedule appt for current symptoms.

## 2018-10-13 NOTE — Telephone Encounter (Signed)
Left VM for patient to call to [facility]

## 2018-10-13 NOTE — Telephone Encounter (Signed)
We could fit him into a 1:45 or 3:45 on Monday.  I am off tomorrow, not sure if anyone can fit him in tomorrow.  Can we see what we can do?  I would rather we see him before more abx called in.

## 2018-10-24 ENCOUNTER — Ambulatory Visit: Payer: 59 | Admitting: Family Medicine

## 2018-10-24 ENCOUNTER — Encounter

## 2018-10-24 ENCOUNTER — Encounter: Payer: Self-pay | Admitting: Family Medicine

## 2018-10-24 VITALS — BP 122/84 | HR 85 | Temp 98.5°F | Ht 66.0 in | Wt 171.5 lb

## 2018-10-24 DIAGNOSIS — J0101 Acute recurrent maxillary sinusitis: Secondary | ICD-10-CM

## 2018-10-24 DIAGNOSIS — J309 Allergic rhinitis, unspecified: Secondary | ICD-10-CM

## 2018-10-24 MED ORDER — FLUTICASONE PROPIONATE 50 MCG/ACT NA SUSP
2.0000 | Freq: Two times a day (BID) | NASAL | 6 refills | Status: DC
Start: 2018-10-24 — End: 2019-09-26

## 2018-10-24 MED ORDER — MONTELUKAST SODIUM 10 MG PO TABS: 10.0000 mg | ORAL_TABLET | Freq: Every day | ORAL | 11 refills | Status: DC

## 2018-10-24 MED ORDER — CETIRIZINE HCL 10 MG PO TABS: 10.0000 mg | ORAL_TABLET | Freq: Every day | ORAL | 11 refills | Status: DC

## 2018-10-24 MED ORDER — PREDNISONE 10 MG PO TABS: ORAL_TABLET | ORAL | 0 refills | Status: DC

## 2018-10-24 NOTE — Progress Notes (Signed)
BP 122/84 (BP Location: Left Arm, Patient Position: Sitting, Cuff Size: Normal)   Pulse 85   Temp 98.5 F (36.9 C)   Ht 5\' 6"  (1.676 m)   Wt 171 lb 8 oz (77.8 kg)   SpO2 98%   BMI 27.68 kg/m    Subjective:    Patient ID: Benjamin Boone, male    DOB: 07/20/1956, 62 y.o.   MRN: [id number]  HPI: Benjamin Boone is a 62 y.o. male  Chief Complaint  Patient presents with  . URI    Patient states that he has been sick since early November, was feeling better with the antibiotic and steriod, but started feeling worse after the completed course.    Here today with ongoing congestion, cough, sinus pressure, fatigue x 1 month. Has completed augmentin and prednisone which he states helped initially but sxs returned about 2 weeks ago. Not trying anything OTC for sxs. Denies fevers, chills, Cp, SOB. Does have a hx of allergic rhinitis and asthma not currently on any medications for them.   Relevant past medical, surgical, family and social history reviewed and updated as indicated. Interim medical history since our last visit reviewed. Allergies and medications reviewed and updated.  Review of Systems  Per HPI unless specifically indicated above     Objective:    BP 122/84 (BP Location: Left Arm, Patient Position: Sitting, Cuff Size: Normal)   Pulse 85   Temp 98.5 F (36.9 C)   Ht 5\' 6"  (1.676 m)   Wt 171 lb 8 oz (77.8 kg)   SpO2 98%   BMI 27.68 kg/m   Wt Readings from Last 3 Encounters:  10/24/18 171 lb 8 oz (77.8 kg)  09/27/18 171 lb 9.6 oz (77.8 kg)  08/16/18 172 lb (78 kg)    Physical Exam  Constitutional: He is oriented to person, place, and time. He appears well-developed and well-nourished. No distress.  HENT:  Head: Atraumatic.  Right Ear: External ear normal.  Left Ear: External ear normal.  Oropharynx and nasal mucosa erythematous and edematous with rhinorrhea present  Eyes: Pupils are equal, round, and reactive to light. Conjunctivae and EOM are  normal.  Neck: Neck supple.  Cardiovascular: Normal rate, regular rhythm and normal heart sounds.  Pulmonary/Chest: Effort normal and breath sounds normal.  Musculoskeletal: Normal range of motion.  Neurological: He is alert and oriented to person, place, and time.  Skin: Skin is warm and dry.  Psychiatric: He has a normal mood and affect. His behavior is normal.  Nursing note and vitals reviewed.   Results for orders placed or performed in visit on 08/16/18  D-Dimer, Quantitative  Result Value Ref Range   D-DIMER 0.55 (H) 0.00 - 0.49 mg/L FEU      Assessment & Plan:   Problem List Items Addressed This Visit      Respiratory   Allergic rhinitis - Primary    Suspect his recurrent sinus issues completely allergy-related. Start zyrtec, singulair, and flonase BID. Supportive care reviewed.        Other Visit Diagnoses    Acute recurrent maxillary sinusitis       Tx with another round of prednisone and strong allergy regimen. Sinus rinses, humidifier, plain mucinex BID. F/u if worsening or not improving   Relevant Medications   cetirizine (ZYRTEC) 10 MG tablet   fluticasone (FLONASE) 50 MCG/ACT nasal spray   predniSONE (DELTASONE) 10 MG tablet       Follow up plan: Return if symptoms worsen  or fail to improve.

## 2018-10-26 ENCOUNTER — Telehealth: Payer: Self-pay | Admitting: Family Medicine

## 2018-10-26 MED ORDER — AMOXICILLIN-POT CLAVULANATE 875-125 MG PO TABS: 1.0000 | ORAL_TABLET | Freq: Two times a day (BID) | ORAL | 0 refills | Status: DC

## 2018-10-26 NOTE — Telephone Encounter (Signed)
Rx sent 

## 2018-10-26 NOTE — Assessment & Plan Note (Signed)
Suspect his recurrent sinus issues completely allergy-related. Start zyrtec, singulair, and flonase BID. Supportive care reviewed.

## 2018-10-26 NOTE — Telephone Encounter (Signed)
Copied from CRM [id number]. Topic: General - Inquiry >> Oct 26, 2018  9:28 AM Maia Pettiesrtiz, Kristie S wrote: Reason for CRM: Pt having green phlegm (coughing up and blowing out). He states he was advised 12/2 by Fleet Contrasachel to call if not feeling better. He has taken the other medications prescribed and not really feeling any better. He states Fleet ContrasRachel advised she may call in ABX for him if no improvement.  [id number] [place] [address] [phone] (Fax)

## 2018-11-02 ENCOUNTER — Ambulatory Visit: Payer: Self-pay | Admitting: *Deleted

## 2018-11-02 MED ORDER — AZITHROMYCIN 250 MG PO TABS: ORAL_TABLET | ORAL | 0 refills | Status: DC

## 2018-11-02 NOTE — Telephone Encounter (Signed)
Unsure if you have received this message.  Please advise thank you

## 2018-11-02 NOTE — Telephone Encounter (Signed)
Pt complains of ongoing nasal congestion,  coughing up green secretion, green nasal drainage; sinus pressure/pain behind his right eye, and his cough keeps him up at night; the pt says that he has tried the medications that were prescribed on 10/24/18; he also says that sometimes it takes more than one round of antibiotics to clear he states that he would like to get this cleared up before Christmas; nurse triage initiated and recommendations made per protocol; the pt is agreeable to being seen in the office but he prefers to have something called [facility] [place]; he can also be contacted at [phone]; will route to office for final disposition.    Reason for Disposition . [1] Using nasal washes and pain medicine > 24 hours AND [2] sinus pain (around cheekbone or eye) persists  Answer Assessment - Initial Assessment Questions 1. ONSET: "When did the cough begin?"      Seen in office 10/26/18 2. SEVERITY: "How bad is the cough today?"      Woke up several times 3. RESPIRATORY DISTRESS: "Describe your breathing."      Breathing ok 4. FEVER: "Do you have a fever?" If so, ask: "What is your temperature, how was it measured, and when did it start?"     no 5. SPUTUM: "Describe the color of your sputum" (clear, white, yellow, green)     green 6. HEMOPTYSIS: "Are you coughing up any blood?" If so ask: "How much?" (flecks, streaks, tablespoons, etc.)     no 7. CARDIAC HISTORY: "Do you have any history of heart disease?" (e.g., heart attack, congestive heart failure)      no 8. LUNG HISTORY: "Do you have any history of lung disease?"  (e.g., pulmonary embolus, asthma, emphysema)     no 9. PE RISK FACTORS: "Do you have a history of blood clots?" (or: recent major surgery, recent prolonged travel, bedridden)     no 10. OTHER SYMPTOMS: "Do you have any other symptoms?" (e.g., runny nose, wheezing, chest pain)       Sinus pain behind right eye, scratchy throat, hoarse 11.  PREGNANCY: "Is there any chance you are pregnant?" "When was your last menstrual period?"       n/a 12. TRAVEL: "Have you traveled out of the country in the last month?" (e.g., travel history, exposures)       no  Protocols used: COUGH - ACUTE PRODUCTIVE-A-AH

## 2018-11-02 NOTE — Addendum Note (Signed)
Addended by: Roosvelt MaserLANE, Krishav Mamone E on: 11/02/2018 04:42 PM   Modules accepted: Orders

## 2018-11-02 NOTE — Telephone Encounter (Signed)
Different antibiotic sent in. Needs to be seen if not better from this

## 2018-11-03 NOTE — Telephone Encounter (Signed)
Called and left detailed message with information on patients vm. DPR was checked.   

## 2018-11-03 NOTE — Telephone Encounter (Signed)
Attempted to notify patient. Line was answered. Noone every spoke. D/C line due to no response.

## 2018-12-23 ENCOUNTER — Encounter: Payer: 59 | Admitting: Unknown Physician Specialty

## 2019-01-03 ENCOUNTER — Other Ambulatory Visit: Payer: Self-pay | Admitting: Nurse Practitioner

## 2019-01-03 NOTE — Telephone Encounter (Signed)
Requested Prescriptions  Pending Prescriptions Disp Refills  . gemfibrozil (LOPID) 600 MG tablet [Pharmacy Med Name: GEMFIBROZIL 600 MG TABLET] 180 tablet 0    Sig: TAKE 1 TABLET (600 MG TOTAL) BY MOUTH 2 (TWO) TIMES DAILY BEFORE A MEAL.     Cardiovascular:  Antilipid - Fibric Acid Derivatives Failed - 01/03/2019  1:46 AM      Failed - LDL in normal range and within 360 days    LDL Calculated  Date Value Ref Range Status  03/21/2018 121 (H) 0 - 99 mg/dL Final         Failed - ALT in normal range and within 180 days    ALT  Date Value Ref Range Status  12/21/2017 42 0 - 44 IU/L Final         Failed - AST in normal range and within 180 days    AST  Date Value Ref Range Status  12/21/2017 38 0 - 40 IU/L Final         Failed - Cr in normal range and within 180 days    Creatinine, Ser  Date Value Ref Range Status  12/21/2017 1.20 0.76 - 1.27 mg/dL Final         Failed - eGFR in normal range and within 180 days    GFR calc Af Amer  Date Value Ref Range Status  12/21/2017 76 >59 mL/min/1.73 Final   GFR calc non Af Amer  Date Value Ref Range Status  12/21/2017 66 >59 mL/min/1.73 Final         Passed - Total Cholesterol in normal range and within 360 days    Cholesterol, Total  Date Value Ref Range Status  03/21/2018 183 100 - 199 mg/dL Final         Passed - HDL in normal range and within 360 days    HDL  Date Value Ref Range Status  03/21/2018 44 >39 mg/dL Final         Passed - Triglycerides in normal range and within 360 days    Triglycerides  Date Value Ref Range Status  03/21/2018 92 0 - 149 mg/dL Final         Passed - Valid encounter within last 12 months    Recent Outpatient Visits          2 months ago Allergic rhinitis, unspecified seasonality, unspecified trigger   [facility] Volney American, PA-C   3 months ago Viral URI with cough   [facility] Volney American, [place]   4 months ago Peripheral edema   [facility] [place], Megan P, DO   8 months ago Acute non-recurrent maxillary sinusitis   [facility] [place], Megan P, DO   9 months ago Contusion of rib on right side, subsequent encounter   [facility] Kathrine Haddock, NP

## 2019-01-13 ENCOUNTER — Other Ambulatory Visit: Payer: Self-pay | Admitting: Family Medicine

## 2019-01-13 NOTE — Telephone Encounter (Signed)
Requested Prescriptions  Pending Prescriptions Disp Refills  . montelukast (SINGULAIR) 10 MG tablet [Pharmacy Med Name: MONTELUKAST SOD 10 MG TABLET] 90 tablet 4    Sig: TAKE 1 TABLET BY MOUTH EVERYDAY AT BEDTIME     Pulmonology:  Leukotriene Inhibitors Passed - 01/13/2019  2:31 AM      Passed - Valid encounter within last 12 months    Recent Outpatient Visits          2 months ago Allergic rhinitis, unspecified seasonality, unspecified trigger   [facility] Particia Nearing, PA-C   3 months ago Viral URI with cough   [facility] Roosvelt Maser [place], [place]   5 months ago Peripheral edema   [facility] [place], Megan P, DO   8 months ago Acute non-recurrent maxillary sinusitis   [facility] [place], Megan P, DO   9 months ago Contusion of rib on right side, subsequent encounter   [facility] Gabriel Cirri, NP

## 2019-01-20 ENCOUNTER — Ambulatory Visit (INDEPENDENT_AMBULATORY_CARE_PROVIDER_SITE_OTHER): Payer: 59 | Admitting: Family Medicine

## 2019-01-20 ENCOUNTER — Encounter: Payer: Self-pay | Admitting: Family Medicine

## 2019-01-20 VITALS — BP 127/86 | HR 77 | Temp 97.7°F | Wt 174.2 lb

## 2019-01-20 DIAGNOSIS — J0101 Acute recurrent maxillary sinusitis: Secondary | ICD-10-CM

## 2019-01-20 MED ORDER — AMOXICILLIN-POT CLAVULANATE 875-125 MG PO TABS: 1.0000 | ORAL_TABLET | Freq: Two times a day (BID) | ORAL | 0 refills | Status: DC

## 2019-01-20 NOTE — Progress Notes (Signed)
BP 127/86   Pulse 77   Temp 97.7 F (36.5 C) (Oral)   Wt 174 lb 3.2 oz (79 kg)   SpO2 98%   BMI 28.12 kg/m    Subjective:    Patient ID: Benjamin Boone, male    DOB: [birth year], 63 y.o.   MRN: [id number]  HPI: Benjamin Boone is a 63 y.o. male  Chief Complaint  Patient presents with  . URI    pt states he has had sinus pressure, congestion, and headaches since Monday. States he has tried taking Mucinex.  . Medication Refill    pt states he needs a 90 day RX for singulair for insurance to cover it   Here today for about a week of congestion, sinus pain and pressure, headaches, fatigue, productive cough. Taking sinus rinses and mucinex as well as allergy regimen with minimal relief. Denies fevers, chills, body aches, CP, SOB. Hx of severe allergic rhinitis, recurrent sinusitis, asthma. Non smoker. Lots of sick contacts.   Relevant past medical, surgical, family and social history reviewed and updated as indicated. Interim medical history since our last visit reviewed. Allergies and medications reviewed and updated.  Review of Systems  Per HPI unless specifically indicated above     Objective:    BP 127/86   Pulse 77   Temp 97.7 F (36.5 C) (Oral)   Wt 174 lb 3.2 oz (79 kg)   SpO2 98%   BMI 28.12 kg/m   Wt Readings from Last 3 Encounters:  01/20/19 174 lb 3.2 oz (79 kg)  10/24/18 171 lb 8 oz (77.8 kg)  09/27/18 171 lb 9.6 oz (77.8 kg)    Physical Exam Vitals signs and nursing note reviewed.  Constitutional:      Appearance: He is well-developed.  HENT:     Head: Atraumatic.     Comments: B/l max sinuses ttp    Right Ear: External ear normal.     Left Ear: External ear normal.     Nose: Congestion present.     Mouth/Throat:     Mouth: Mucous membranes are moist.     Pharynx: Posterior oropharyngeal erythema present. No oropharyngeal exudate.  Eyes:     Conjunctiva/sclera: Conjunctivae normal.     Pupils: Pupils are equal, round, and  reactive to light.  Neck:     Musculoskeletal: Normal range of motion and neck supple.  Cardiovascular:     Rate and Rhythm: Normal rate and regular rhythm.     Heart sounds: Normal heart sounds.  Pulmonary:     Effort: Pulmonary effort is normal. No respiratory distress.     Breath sounds: No wheezing or rales.  Musculoskeletal: Normal range of motion.  Lymphadenopathy:     Cervical: No cervical adenopathy.  Skin:    General: Skin is warm and dry.  Neurological:     Mental Status: He is alert and oriented to person, place, and time.  Psychiatric:        Behavior: Behavior normal.     Results for orders placed or performed in visit on 08/16/18  D-Dimer, Quantitative  Result Value Ref Range   D-DIMER 0.55 (H) 0.00 - 0.49 mg/L FEU      Assessment & Plan:   Problem List Items Addressed This Visit    None    Visit Diagnoses    Acute recurrent maxillary sinusitis    -  Primary   Tx with augmentin, mucinex, sinus rinses, and continued allergy regimen. F/u if worsening or  not improving   Relevant Medications   amoxicillin-clavulanate (AUGMENTIN) 875-125 MG tablet       Follow up plan: Return if symptoms worsen or fail to improve.

## 2019-01-27 ENCOUNTER — Other Ambulatory Visit: Payer: Self-pay | Admitting: Family Medicine

## 2019-01-27 NOTE — Telephone Encounter (Signed)
Called pt to see if he needed this medicine and to see about scheduling appointment no answer and no voicemail

## 2019-01-27 NOTE — Telephone Encounter (Signed)
° °  Pt said he did make the req for the  Amoxicillin   But did not want to make an appt ,said he will see how he feel tomorrow

## 2019-04-05 DIAGNOSIS — L2389 Allergic contact dermatitis due to other agents: Secondary | ICD-10-CM | POA: Diagnosis not present

## 2019-04-12 DIAGNOSIS — J328 Other chronic sinusitis: Secondary | ICD-10-CM | POA: Diagnosis not present

## 2019-04-12 DIAGNOSIS — J342 Deviated nasal septum: Secondary | ICD-10-CM | POA: Diagnosis not present

## 2019-04-12 DIAGNOSIS — R040 Epistaxis: Secondary | ICD-10-CM | POA: Diagnosis not present

## 2019-08-21 ENCOUNTER — Encounter: Payer: Self-pay | Admitting: Family Medicine

## 2019-08-21 ENCOUNTER — Ambulatory Visit (INDEPENDENT_AMBULATORY_CARE_PROVIDER_SITE_OTHER): Payer: 59 | Admitting: Family Medicine

## 2019-08-21 ENCOUNTER — Other Ambulatory Visit: Payer: Self-pay

## 2019-08-21 VITALS — Ht 66.0 in | Wt 160.0 lb

## 2019-08-21 DIAGNOSIS — J0100 Acute maxillary sinusitis, unspecified: Secondary | ICD-10-CM

## 2019-08-21 DIAGNOSIS — J309 Allergic rhinitis, unspecified: Secondary | ICD-10-CM

## 2019-08-21 MED ORDER — AMOXICILLIN-POT CLAVULANATE 875-125 MG PO TABS: 1.0000 | ORAL_TABLET | Freq: Two times a day (BID) | ORAL | 0 refills | Status: DC

## 2019-08-21 NOTE — Assessment & Plan Note (Signed)
Continue current regimen, typically stable and under good control

## 2019-08-21 NOTE — Progress Notes (Signed)
Ht 5\' 6"  (1.676 m)   Wt 160 lb (72.6 kg)   BMI 25.82 kg/m    Subjective:    Patient ID: Benjamin Boone, male    DOB: [birth year], 63 y.o.   MRN: [id number]  HPI: Benjamin Boone is a 63 y.o. male  Chief Complaint  Patient presents with  . Sinusitis    Patient states he thinks he has a sinus infection. Patient states they are recurrent for him. Ongoing over 1 week.  . Nasal Congestion    . This visit was completed via WebEx due to the restrictions of the COVID-19 pandemic. All issues as above were discussed and addressed. Physical exam was done as above through visual confirmation on WebEx. If it was felt that the patient should be evaluated in the office, they were directed there. The patient verbally consented to this visit. . Location of the patient: home . Location of the provider: work . Those involved with this call:  . Provider: 77, PA-C . CMA: Roosvelt Maser, CMA . Front Desk/Registration: Myrtha Mantis  . Time spent on call: 15 minutes with patient face to face via video conference. More than 50% of this time was spent in counseling and coordination of care. 5 minutes total spent in review of patient's record and preparation of their chart. I verified patient identity using two factors (patient name and date of birth). Patient consents verbally to being seen via telemedicine visit today.   Over a week of congestion, drainage, sinus pain and pressure, headaches, productive cough. Hx of allergic rhinitis, compliant with allergy regimen of zyrtec, singulair and flonase daily which typically keeps things under good control. Does tend to get flare ups during the fall change of season. Denies fevers, chills, CP, SOB.   Relevant past medical, surgical, family and social history reviewed and updated as indicated. Interim medical history since our last visit reviewed. Allergies and medications reviewed and updated.  Review of Systems  Per HPI unless  specifically indicated above     Objective:    Ht 5\' 6"  (1.676 m)   Wt 160 lb (72.6 kg)   BMI 25.82 kg/m   Wt Readings from Last 3 Encounters:  08/21/19 160 lb (72.6 kg)  01/20/19 174 lb 3.2 oz (79 kg)  10/24/18 171 lb 8 oz (77.8 kg)    Physical Exam Vitals signs and nursing note reviewed.  Constitutional:      General: He is not in acute distress.    Appearance: Normal appearance.  HENT:     Head: Atraumatic.     Right Ear: External ear normal.     Left Ear: External ear normal.     Nose: Congestion present.     Mouth/Throat:     Mouth: Mucous membranes are moist.     Pharynx: Oropharynx is clear. Posterior oropharyngeal erythema present.  Eyes:     Extraocular Movements: Extraocular movements intact.     Conjunctiva/sclera: Conjunctivae normal.  Neck:     Musculoskeletal: Normal range of motion.  Pulmonary:     Effort: Pulmonary effort is normal. No respiratory distress.  Musculoskeletal: Normal range of motion.  Skin:    General: Skin is dry.     Findings: No erythema or rash.  Neurological:     Mental Status: He is oriented to person, place, and time.  Psychiatric:        Mood and Affect: Mood normal.        Thought Content: Thought content normal.  Judgment: Judgment normal.     Results for orders placed or performed in visit on 08/16/18  D-Dimer, Quantitative  Result Value Ref Range   D-DIMER 0.55 (H) 0.00 - 0.49 mg/L FEU      Assessment & Plan:   Problem List Items Addressed This Visit      Respiratory   Allergic rhinitis    Continue current regimen, typically stable and under good control       Other Visit Diagnoses    Acute maxillary sinusitis, recurrence not specified    -  Primary   Tx with augmentin, continued allergy regimen, sinus rinses and other supportive care. F/u if worsening or not improving   Relevant Medications   amoxicillin-clavulanate (AUGMENTIN) 875-125 MG tablet       Follow up plan: Return for CPE - patient will  call to schedule.

## 2019-08-28 ENCOUNTER — Other Ambulatory Visit: Payer: Self-pay | Admitting: Family Medicine

## 2019-08-28 MED ORDER — AZITHROMYCIN 250 MG PO TABS: ORAL_TABLET | ORAL | 0 refills | Status: DC

## 2019-08-28 NOTE — Telephone Encounter (Signed)
Patient notified

## 2019-08-28 NOTE — Telephone Encounter (Signed)
Still has several days left on his augmentin and should finish that before considering starting the zpak I just sent in

## 2019-08-28 NOTE — Telephone Encounter (Signed)
Routing to provider. Patient had virtual visit 08/21/19.

## 2019-08-28 NOTE — Telephone Encounter (Signed)
rx refill amoxicillin-clavulanate (AUGMENTIN) 875-125 MG  PHARMACY [id number] - [place], [place] [phone] (Phone) [phone] (Fax)

## 2019-09-26 ENCOUNTER — Other Ambulatory Visit: Payer: Self-pay | Admitting: Family Medicine

## 2019-10-04 ENCOUNTER — Other Ambulatory Visit: Payer: Self-pay

## 2019-10-04 ENCOUNTER — Ambulatory Visit (INDEPENDENT_AMBULATORY_CARE_PROVIDER_SITE_OTHER): Payer: 59 | Admitting: Family Medicine

## 2019-10-04 ENCOUNTER — Encounter: Payer: Self-pay | Admitting: Family Medicine

## 2019-10-04 VITALS — BP 122/86 | HR 76 | Temp 98.4°F | Ht 63.5 in | Wt 169.0 lb

## 2019-10-04 DIAGNOSIS — E782 Mixed hyperlipidemia: Secondary | ICD-10-CM | POA: Diagnosis not present

## 2019-10-04 DIAGNOSIS — Z125 Encounter for screening for malignant neoplasm of prostate: Secondary | ICD-10-CM | POA: Diagnosis not present

## 2019-10-04 DIAGNOSIS — J309 Allergic rhinitis, unspecified: Secondary | ICD-10-CM | POA: Diagnosis not present

## 2019-10-04 DIAGNOSIS — Z23 Encounter for immunization: Secondary | ICD-10-CM

## 2019-10-04 DIAGNOSIS — J4520 Mild intermittent asthma, uncomplicated: Secondary | ICD-10-CM | POA: Diagnosis not present

## 2019-10-04 DIAGNOSIS — Z0000 Encounter for general adult medical examination without abnormal findings: Secondary | ICD-10-CM | POA: Diagnosis not present

## 2019-10-04 LAB — UA/M W/RFLX CULTURE, ROUTINE
Bilirubin, UA: NEGATIVE
Glucose, UA: NEGATIVE
Ketones, UA: NEGATIVE
Leukocytes,UA: NEGATIVE
Nitrite, UA: NEGATIVE
Protein,UA: NEGATIVE
RBC, UA: NEGATIVE
Specific Gravity, UA: 1.015 (ref 1.005–1.030)
Urobilinogen, Ur: 0.2 mg/dL (ref 0.2–1.0)
pH, UA: 5 (ref 5.0–7.5)

## 2019-10-04 MED ORDER — AZELASTINE HCL 0.1 % NA SOLN: 1.0000 | Freq: Two times a day (BID) | NASAL | 2 refills | Status: DC

## 2019-10-04 MED ORDER — ALBUTEROL SULFATE HFA 108 (90 BASE) MCG/ACT IN AERS: 2.0000 | INHALATION_SPRAY | Freq: Four times a day (QID) | RESPIRATORY_TRACT | 0 refills | Status: DC | PRN

## 2019-10-04 MED ORDER — GEMFIBROZIL 600 MG PO TABS: 600.0000 mg | ORAL_TABLET | Freq: Two times a day (BID) | ORAL | 1 refills | Status: DC

## 2019-10-04 NOTE — Progress Notes (Signed)
BP 122/86   Pulse 76   Temp 98.4 F (36.9 C) (Oral)   Ht 5' 3.5" (1.613 m)   Wt 169 lb (76.7 kg)   SpO2 96%   BMI 29.47 kg/m    Subjective:    Patient ID: Benjamin Boone, male    DOB: [birth year], 63 y.o.   MRN: [id number]  HPI: Benjamin Boone is a 63 y.o. male presenting on 10/04/2019 for comprehensive medical examination. Current medical complaints include:see below  Still having arthritis issues, particularly in knees. Taking OTC pain relievers prn with mild relief. No joint swelling, redness, fevers.   Stopped taking singulair about 2 weeks ago to see if that was causing his joint pains. Has had some allergy/sinus issues since this time. Still taking zyrtec and flonase daily. Asthma stable with prn use of albuterol at this time.   Stopped the lopid several weeks ago as well in case this was contributing to his joint pains, no improvement when doing that either. Trying to eat well and stay active.   He currently lives with: Interim Problems from his last visit: no  Depression Screen done today and results listed below:  Depression screen [facility] 2/9 10/04/2019 12/21/2017 10/20/2017 10/30/2015  Decreased Interest 0 0 0 0  Down, Depressed, Hopeless 0 0 0 0  PHQ - 2 Score 0 0 0 0  Altered sleeping 1 0 - -  Tired, decreased energy 0 0 - -  Change in appetite 0 0 - -  Feeling bad or failure about yourself  0 0 - -  Trouble concentrating 0 0 - -  Moving slowly or fidgety/restless 0 0 - -  Suicidal thoughts 0 0 - -  PHQ-9 Score 1 0 - -    The patient does not have a history of falls. I did complete a risk assessment for falls. A plan of care for falls was documented.   Past Medical History:  Past Medical History:  Diagnosis Date  . Allergy   . Arthritis   . Hyperlipidemia   . Venous insufficiency     Surgical History:  Past Surgical History:  Procedure Laterality Date  . TONSILLECTOMY    . TONSILLECTOMY      Medications:  Current Outpatient  Medications on File Prior to Visit  Medication Sig  . cetirizine (ZYRTEC) 10 MG tablet Take 1 tablet (10 mg total) by mouth daily.  . fluticasone (FLONASE) 50 MCG/ACT nasal spray PLACE 2 SPRAYS INTO BOTH NOSTRILS 2 (TWO) TIMES DAILY.   No current facility-administered medications on file prior to visit.     Allergies:  Allergies  Allergen Reactions  . Doxycycline Rash    Social History:  Social History   Socioeconomic History  . Marital status: Married    Spouse name: Not on file  . Number of children: Not on file  . Years of education: Not on file  . Highest education level: Not on file  Occupational History  . Not on file  Social Needs  . Financial resource strain: Not on file  . Food insecurity    Worry: Not on file    Inability: Not on file  . Transportation needs    Medical: Not on file    Non-medical: Not on file  Tobacco Use  . Smoking status: Never Smoker  . Smokeless tobacco: Never Used  Substance and Sexual Activity  . Alcohol use: No  . Drug use: No  . Sexual activity: Yes  Lifestyle  . Physical activity  Days per week: Not on file    Minutes per session: Not on file  . Stress: Not on file  Relationships  . Social Musicianconnections    Talks on phone: Not on file    Gets together: Not on file    Attends religious service: Not on file    Active member of club or organization: Not on file    Attends meetings of clubs or organizations: Not on file    Relationship status: Not on file  . Intimate partner violence    Fear of current or ex partner: Not on file    Emotionally abused: Not on file    Physically abused: Not on file    Forced sexual activity: Not on file  Other Topics Concern  . Not on file  Social History Narrative  . Not on file   Social History   Tobacco Use  Smoking Status Never Smoker  Smokeless Tobacco Never Used   Social History   Substance and Sexual Activity  Alcohol Use No    Family History:  Family History  Problem  Relation Age of Onset  . Heart disease Father        MI  . Heart disease Mother        pacemaker  . Heart attack Maternal Uncle     Past medical history, surgical history, medications, allergies, family history and social history reviewed with patient today and changes made to appropriate areas of the chart.   Review of Systems - General ROS: negative Psychological ROS: negative Ophthalmic ROS: negative ENT ROS: positive for - nasal congestion, nasal discharge and sinus pain Allergy and Immunology ROS: positive for - seasonal allergies Hematological and Lymphatic ROS: negative Endocrine ROS: negative Breast ROS: negative for breast lumps Respiratory ROS: no cough, shortness of breath, or wheezing Cardiovascular ROS: no chest pain or dyspnea on exertion Gastrointestinal ROS: no abdominal pain, change in bowel habits, or black or bloody stools Genito-Urinary ROS: no dysuria, trouble voiding, or hematuria Musculoskeletal ROS: positive for - joint pain Neurological ROS: no TIA or stroke symptoms Dermatological ROS: negative All other ROS negative except what is listed above and in the HPI.      Objective:    BP 122/86   Pulse 76   Temp 98.4 F (36.9 C) (Oral)   Ht 5' 3.5" (1.613 m)   Wt 169 lb (76.7 kg)   SpO2 96%   BMI 29.47 kg/m   Wt Readings from Last 3 Encounters:  10/04/19 169 lb (76.7 kg)  08/21/19 160 lb (72.6 kg)  01/20/19 174 lb 3.2 oz (79 kg)    Physical Exam Vitals signs and nursing note reviewed.  Constitutional:      General: He is not in acute distress.    Appearance: He is well-developed.  HENT:     Head: Atraumatic.     Right Ear: Tympanic membrane and external ear normal.     Left Ear: Tympanic membrane and external ear normal.     Nose: Rhinorrhea present.     Mouth/Throat:     Mouth: Mucous membranes are moist.     Pharynx: Oropharynx is clear. Posterior oropharyngeal erythema present.  Eyes:     General: No scleral icterus.     Conjunctiva/sclera: Conjunctivae normal.     Pupils: Pupils are equal, round, and reactive to light.  Neck:     Musculoskeletal: Normal range of motion and neck supple.  Cardiovascular:     Rate and Rhythm: Normal rate and  regular rhythm.     Heart sounds: Normal heart sounds. No murmur.  Pulmonary:     Effort: Pulmonary effort is normal. No respiratory distress.     Breath sounds: Normal breath sounds.  Abdominal:     General: Bowel sounds are normal. There is no distension.     Palpations: Abdomen is soft. There is no mass.     Tenderness: There is no abdominal tenderness. There is no guarding.  Genitourinary:    Prostate: Normal.     Rectum: Normal.  Musculoskeletal: Normal range of motion.        General: No swelling or tenderness.  Skin:    General: Skin is warm and dry.     Findings: No rash.  Neurological:     General: No focal deficit present.     Mental Status: He is alert and oriented to person, place, and time.     Deep Tendon Reflexes: Reflexes are normal and symmetric.  Psychiatric:        Mood and Affect: Mood normal.        Behavior: Behavior normal.        Thought Content: Thought content normal.        Judgment: Judgment normal.     Results for orders placed or performed in visit on 10/04/19  CBC with Differential/Platelet out  Result Value Ref Range   WBC 7.8 3.4 - 10.8 x10E3/uL   RBC 5.27 4.14 - 5.80 x10E6/uL   Hemoglobin 15.1 13.0 - 17.7 g/dL   Hematocrit 44.9 20.1 - 51.0 %   MCV 84 79 - 97 fL   MCH 28.7 26.6 - 33.0 pg   MCHC 34.2 31.5 - 35.7 g/dL   RDW 00.7 12.1 - 97.5 %   Platelets 327 150 - 450 x10E3/uL   Neutrophils 55 Not Estab. %   Lymphs 28 Not Estab. %   Monocytes 9 Not Estab. %   Eos 7 Not Estab. %   Basos 1 Not Estab. %   Neutrophils Absolute 4.2 1.4 - 7.0 x10E3/uL   Lymphocytes Absolute 2.1 0.7 - 3.1 x10E3/uL   Monocytes Absolute 0.7 0.1 - 0.9 x10E3/uL   EOS (ABSOLUTE) 0.6 (H) 0.0 - 0.4 x10E3/uL   Basophils Absolute 0.1 0.0 - 0.2  x10E3/uL   Immature Granulocytes 0 Not Estab. %   Immature Grans (Abs) 0.0 0.0 - 0.1 x10E3/uL  Comprehensive metabolic panel  Result Value Ref Range   Glucose 83 65 - 99 mg/dL   BUN 16 8 - 27 mg/dL   Creatinine, Ser 8.83 0.76 - 1.27 mg/dL   GFR calc non Af Amer 79 >59 mL/min/1.73   GFR calc Af Amer 91 >59 mL/min/1.73   BUN/Creatinine Ratio 16 10 - 24   Sodium 137 134 - 144 mmol/L   Potassium 4.5 3.5 - 5.2 mmol/L   Chloride 100 96 - 106 mmol/L   CO2 24 20 - 29 mmol/L   Calcium 10.0 8.6 - 10.2 mg/dL   Total Protein 7.1 6.0 - 8.5 g/dL   Albumin 4.4 3.8 - 4.8 g/dL   Globulin, Total 2.7 1.5 - 4.5 g/dL   Albumin/Globulin Ratio 1.6 1.2 - 2.2   Bilirubin Total 0.2 0.0 - 1.2 mg/dL   Alkaline Phosphatase 97 39 - 117 IU/L   AST 20 0 - 40 IU/L   ALT 23 0 - 44 IU/L  Lipid Panel w/o Chol/HDL Ratio out  Result Value Ref Range   Cholesterol, Total 223 (H) 100 - 199 mg/dL  Triglycerides 432 (H) 0 - 149 mg/dL   HDL 33 (L) >10 mg/dL   VLDL Cholesterol Cal 75 (H) 5 - 40 mg/dL   LDL Chol Calc (NIH) 272 (H) 0 - 99 mg/dL  UA/M w/rflx Culture, Routine   Specimen: Urine   URINE  Result Value Ref Range   Specific Gravity, UA 1.015 1.005 - 1.030   pH, UA 5.0 5.0 - 7.5   Color, UA Yellow Yellow   Appearance Ur Clear Clear   Leukocytes,UA Negative Negative   Protein,UA Negative Negative/Trace   Glucose, UA Negative Negative   Ketones, UA Negative Negative   RBC, UA Negative Negative   Bilirubin, UA Negative Negative   Urobilinogen, Ur 0.2 0.2 - 1.0 mg/dL   Nitrite, UA Negative Negative  PSA  Result Value Ref Range   Prostate Specific Ag, Serum 0.9 0.0 - 4.0 ng/mL      Assessment & Plan:   Problem List Items Addressed This Visit      Respiratory   Allergic rhinitis    Restart singulair, continue remainder of allergy regimen. Can start astelin nasal spray if continuing to have issue with rhinorrhea      Asthma    Stable and under good control, continue current albuterol regimen prn       Relevant Medications   albuterol (VENTOLIN HFA) 108 (90 Base) MCG/ACT inhaler     Other   Hyperlipidemia - Primary    Has been off lopid for at least a few weeks now, no evidence that it's causing worsening joint pains. Suggested restarting medicine and continuing lifestyle modifications. Recheck lipids today      Relevant Medications   gemfibrozil (LOPID) 600 MG tablet    Other Visit Diagnoses    Annual physical exam       Relevant Orders   CBC with Differential/Platelet out (Completed)   Comprehensive metabolic panel (Completed)   Lipid Panel w/o Chol/HDL Ratio out (Completed)   UA/M w/rflx Culture, Routine (Completed)   Screening for prostate cancer       Relevant Orders   PSA (Completed)   Flu vaccine need       Relevant Orders   Flu Vaccine QUAD 6+ mos PF IM (Fluarix Quad PF) (Completed)       Discussed aspirin prophylaxis for myocardial infarction prevention and decision was it was recommended  LABORATORY TESTING:  Health maintenance labs ordered today as discussed above.   The natural history of prostate cancer and ongoing controversy regarding screening and potential treatment outcomes of prostate cancer has been discussed with the patient. The meaning of a false positive PSA and a false negative PSA has been discussed. He indicates understanding of the limitations of this screening test and wishes to proceed with screening PSA testing.   IMMUNIZATIONS:   - Tdap: Tetanus vaccination status reviewed: last tetanus booster within 10 years. - Influenza: Administered today  SCREENING: - Colonoscopy: Up to date  Discussed with patient purpose of the colonoscopy is to detect colon cancer at curable precancerous or early stages   PATIENT COUNSELING:    Sexuality: Discussed sexually transmitted diseases, partner selection, use of condoms, avoidance of unintended pregnancy  and contraceptive alternatives.   Advised to avoid cigarette smoking.  I discussed with the  patient that most people either abstain from alcohol or drink within safe limits (<=14/week and <=4 drinks/occasion for males, <=7/weeks and <= 3 drinks/occasion for females) and that the risk for alcohol disorders and other health effects rises proportionally with the  number of drinks per week and how often a drinker exceeds daily limits.  Discussed cessation/primary prevention of drug use and availability of treatment for abuse.   Diet: Encouraged to adjust caloric intake to maintain  or achieve ideal body weight, to reduce intake of dietary saturated fat and total fat, to limit sodium intake by avoiding high sodium foods and not adding table salt, and to maintain adequate dietary potassium and calcium preferably from fresh fruits, vegetables, and low-fat dairy products.    stressed the importance of regular exercise  Injury prevention: Discussed safety belts, safety helmets, smoke detector, smoking near bedding or upholstery.   Dental health: Discussed importance of regular tooth brushing, flossing, and dental visits.   Follow up plan: NEXT PREVENTATIVE PHYSICAL DUE IN 1 YEAR. Return in about 6 months (around 04/02/2020) for 6 month f/u.

## 2019-10-04 NOTE — Patient Instructions (Signed)
Diclofenac gel

## 2019-10-05 LAB — CBC WITH DIFFERENTIAL/PLATELET
Basophils Absolute: 0.1 10*3/uL (ref 0.0–0.2)
Basos: 1 %
EOS (ABSOLUTE): 0.6 10*3/uL — ABNORMAL HIGH (ref 0.0–0.4)
Eos: 7 %
Hematocrit: 44.1 % (ref 37.5–51.0)
Hemoglobin: 15.1 g/dL (ref 13.0–17.7)
Immature Grans (Abs): 0 10*3/uL (ref 0.0–0.1)
Immature Granulocytes: 0 %
Lymphocytes Absolute: 2.1 10*3/uL (ref 0.7–3.1)
Lymphs: 28 %
MCH: 28.7 pg (ref 26.6–33.0)
MCHC: 34.2 g/dL (ref 31.5–35.7)
MCV: 84 fL (ref 79–97)
Monocytes Absolute: 0.7 10*3/uL (ref 0.1–0.9)
Monocytes: 9 %
Neutrophils Absolute: 4.2 10*3/uL (ref 1.4–7.0)
Neutrophils: 55 %
Platelets: 327 10*3/uL (ref 150–450)
RBC: 5.27 x10E6/uL (ref 4.14–5.80)
RDW: 14.1 % (ref 11.6–15.4)
WBC: 7.8 10*3/uL (ref 3.4–10.8)

## 2019-10-05 LAB — COMPREHENSIVE METABOLIC PANEL
ALT: 23 IU/L (ref 0–44)
AST: 20 IU/L (ref 0–40)
Albumin/Globulin Ratio: 1.6 (ref 1.2–2.2)
Albumin: 4.4 g/dL (ref 3.8–4.8)
Alkaline Phosphatase: 97 IU/L (ref 39–117)
BUN/Creatinine Ratio: 16 (ref 10–24)
BUN: 16 mg/dL (ref 8–27)
Bilirubin Total: 0.2 mg/dL (ref 0.0–1.2)
CO2: 24 mmol/L (ref 20–29)
Calcium: 10 mg/dL (ref 8.6–10.2)
Chloride: 100 mmol/L (ref 96–106)
Creatinine, Ser: 1.02 mg/dL (ref 0.76–1.27)
GFR calc Af Amer: 91 mL/min/{1.73_m2} (ref >59)
GFR calc non Af Amer: 79 mL/min/{1.73_m2} (ref >59)
Globulin, Total: 2.7 g/dL (ref 1.5–4.5)
Glucose: 83 mg/dL (ref 65–99)
Potassium: 4.5 mmol/L (ref 3.5–5.2)
Sodium: 137 mmol/L (ref 134–144)
Total Protein: 7.1 g/dL (ref 6.0–8.5)

## 2019-10-05 LAB — LIPID PANEL W/O CHOL/HDL RATIO
Cholesterol, Total: 223 mg/dL — ABNORMAL HIGH (ref 100–199)
HDL: 33 mg/dL — ABNORMAL LOW (ref >39)
LDL Chol Calc (NIH): 115 mg/dL — ABNORMAL HIGH (ref 0–99)
Triglycerides: 432 mg/dL — ABNORMAL HIGH (ref 0–149)
VLDL Cholesterol Cal: 75 mg/dL — ABNORMAL HIGH (ref 5–40)

## 2019-10-05 LAB — PSA: Prostate Specific Ag, Serum: 0.9 ng/mL (ref 0.0–4.0)

## 2019-10-09 NOTE — Assessment & Plan Note (Signed)
Restart singulair, continue remainder of allergy regimen. Can start astelin nasal spray if continuing to have issue with rhinorrhea

## 2019-10-09 NOTE — Assessment & Plan Note (Signed)
Stable and under good control, continue current albuterol regimen prn

## 2019-10-09 NOTE — Assessment & Plan Note (Signed)
Has been off lopid for at least a few weeks now, no evidence that it's causing worsening joint pains. Suggested restarting medicine and continuing lifestyle modifications. Recheck lipids today

## 2019-11-10 ENCOUNTER — Other Ambulatory Visit: Payer: Self-pay | Admitting: Family Medicine

## 2019-11-10 NOTE — Telephone Encounter (Signed)
Forwarding medication refill request to PCP for review. 

## 2019-12-01 ENCOUNTER — Other Ambulatory Visit: Payer: Self-pay

## 2019-12-01 ENCOUNTER — Encounter: Payer: Self-pay | Admitting: Family Medicine

## 2019-12-01 ENCOUNTER — Ambulatory Visit (INDEPENDENT_AMBULATORY_CARE_PROVIDER_SITE_OTHER): Payer: 59 | Admitting: Family Medicine

## 2019-12-01 VITALS — Ht 66.0 in | Wt 165.0 lb

## 2019-12-01 DIAGNOSIS — M25422 Effusion, left elbow: Secondary | ICD-10-CM | POA: Diagnosis not present

## 2019-12-01 NOTE — Progress Notes (Signed)
Ht 5\' 6"  (1.676 m)   Wt 165 lb (74.8 kg)   BMI 26.63 kg/m    Subjective:    Patient ID: Benjamin Boone, male    DOB: 1956/02/18, 64 y.o.   MRN: [id number]  HPI: Benjamin Boone is a 64 y.o. male  Chief Complaint  Patient presents with  . Joint Swelling    left elbow x 5 days. pt stated it feels a bit warm    . This visit was completed via WebEx due to the restrictions of the COVID-19 pandemic. All issues as above were discussed and addressed. Physical exam was done as above through visual confirmation on WebEx. If it was felt that the patient should be evaluated in the office, they were directed there. The patient verbally consented to this visit. . Location of the patient: home . Location of the provider: home . Those involved with this call:  . Provider: 77, PA-C . CMA: Roosvelt Maser, CMA . Front Desk/Registration: Elton Sin  . Time spent on call: 15 minutes with patient face to face via video conference. More than 50% of this time was spent in counseling and coordination of care. 5 minutes total spent in review of patient's record and preparation of their chart. I verified patient identity using two factors (patient name and date of birth). Patient consents verbally to being seen via telemedicine visit today.   Patient presenting today with 5 days of left elbow swelling, about the size of a golf ball. Seems to improve overnight and then throughout the day swell back up some. No redness, fevers, injury, drainage, pain with movement. Not trying anything OTC for sxs.   Relevant past medical, surgical, family and social history reviewed and updated as indicated. Interim medical history since our last visit reviewed. Allergies and medications reviewed and updated.  Review of Systems  Per HPI unless specifically indicated above     Objective:    Ht 5\' 6"  (1.676 m)   Wt 165 lb (74.8 kg)   BMI 26.63 kg/m   Wt Readings from Last 3 Encounters:    12/01/19 165 lb (74.8 kg)  10/04/19 169 lb (76.7 kg)  08/21/19 160 lb (72.6 kg)    Physical Exam Vitals and nursing note reviewed.  Constitutional:      General: He is not in acute distress.    Appearance: Normal appearance.  HENT:     Head: Atraumatic.     Right Ear: External ear normal.     Left Ear: External ear normal.     Nose: Nose normal. No congestion.     Mouth/Throat:     Mouth: Mucous membranes are moist.     Pharynx: Oropharynx is clear.  Eyes:     Extraocular Movements: Extraocular movements intact.     Conjunctiva/sclera: Conjunctivae normal.  Cardiovascular:     Rate and Rhythm: Normal rate and regular rhythm.  Pulmonary:     Effort: Pulmonary effort is normal. No respiratory distress.  Musculoskeletal:        General: Normal range of motion.     Cervical back: Normal range of motion.     Comments: Inflamed left olecranon bursa, no erythema, ttp per patient's self palpation  Skin:    General: Skin is dry.     Findings: No erythema or rash.  Neurological:     Mental Status: He is oriented to person, place, and time.  Psychiatric:        Mood and Affect: Mood normal.  Thought Content: Thought content normal.        Judgment: Judgment normal.     Results for orders placed or performed in visit on 10/04/19  CBC with Differential/Platelet out  Result Value Ref Range   WBC 7.8 3.4 - 10.8 x10E3/uL   RBC 5.27 4.14 - 5.80 x10E6/uL   Hemoglobin 15.1 13.0 - 17.7 g/dL   Hematocrit 83.3 82.5 - 51.0 %   MCV 84 79 - 97 fL   MCH 28.7 26.6 - 33.0 pg   MCHC 34.2 31.5 - 35.7 g/dL   RDW 05.3 97.6 - 73.4 %   Platelets 327 150 - 450 x10E3/uL   Neutrophils 55 Not Estab. %   Lymphs 28 Not Estab. %   Monocytes 9 Not Estab. %   Eos 7 Not Estab. %   Basos 1 Not Estab. %   Neutrophils Absolute 4.2 1.4 - 7.0 x10E3/uL   Lymphocytes Absolute 2.1 0.7 - 3.1 x10E3/uL   Monocytes Absolute 0.7 0.1 - 0.9 x10E3/uL   EOS (ABSOLUTE) 0.6 (H) 0.0 - 0.4 x10E3/uL   Basophils  Absolute 0.1 0.0 - 0.2 x10E3/uL   Immature Granulocytes 0 Not Estab. %   Immature Grans (Abs) 0.0 0.0 - 0.1 x10E3/uL  Comprehensive metabolic panel  Result Value Ref Range   Glucose 83 65 - 99 mg/dL   BUN 16 8 - 27 mg/dL   Creatinine, Ser 1.93 0.76 - 1.27 mg/dL   GFR calc non Af Amer 79 >59 mL/min/1.73   GFR calc Af Amer 91 >59 mL/min/1.73   BUN/Creatinine Ratio 16 10 - 24   Sodium 137 134 - 144 mmol/L   Potassium 4.5 3.5 - 5.2 mmol/L   Chloride 100 96 - 106 mmol/L   CO2 24 20 - 29 mmol/L   Calcium 10.0 8.6 - 10.2 mg/dL   Total Protein 7.1 6.0 - 8.5 g/dL   Albumin 4.4 3.8 - 4.8 g/dL   Globulin, Total 2.7 1.5 - 4.5 g/dL   Albumin/Globulin Ratio 1.6 1.2 - 2.2   Bilirubin Total 0.2 0.0 - 1.2 mg/dL   Alkaline Phosphatase 97 39 - 117 IU/L   AST 20 0 - 40 IU/L   ALT 23 0 - 44 IU/L  Lipid Panel w/o Chol/HDL Ratio out  Result Value Ref Range   Cholesterol, Total 223 (H) 100 - 199 mg/dL   Triglycerides 790 (H) 0 - 149 mg/dL   HDL 33 (L) >24 mg/dL   VLDL Cholesterol Cal 75 (H) 5 - 40 mg/dL   LDL Chol Calc (NIH) 097 (H) 0 - 99 mg/dL  UA/M w/rflx Culture, Routine   Specimen: Urine   URINE  Result Value Ref Range   Specific Gravity, UA 1.015 1.005 - 1.030   pH, UA 5.0 5.0 - 7.5   Color, UA Yellow Yellow   Appearance Ur Clear Clear   Leukocytes,UA Negative Negative   Protein,UA Negative Negative/Trace   Glucose, UA Negative Negative   Ketones, UA Negative Negative   RBC, UA Negative Negative   Bilirubin, UA Negative Negative   Urobilinogen, Ur 0.2 0.2 - 1.0 mg/dL   Nitrite, UA Negative Negative  PSA  Result Value Ref Range   Prostate Specific Ag, Serum 0.9 0.0 - 4.0 ng/mL      Assessment & Plan:   Problem List Items Addressed This Visit    None    Visit Diagnoses    Effusion of left olecranon bursa    -  Primary   Discussed compression, epsom salt soaks.  Return precautions, expectations on time to resolution reviewed. Will drain if patient desires but discussed fail rates        Follow up plan: Return if symptoms worsen or fail to improve.

## 2020-04-03 ENCOUNTER — Encounter: Payer: Self-pay | Admitting: Family Medicine

## 2020-04-03 ENCOUNTER — Ambulatory Visit (INDEPENDENT_AMBULATORY_CARE_PROVIDER_SITE_OTHER): Payer: 59 | Admitting: Family Medicine

## 2020-04-03 ENCOUNTER — Other Ambulatory Visit: Payer: Self-pay

## 2020-04-03 VITALS — BP 134/84 | HR 75 | Temp 98.6°F | Ht 64.49 in | Wt 161.0 lb

## 2020-04-03 DIAGNOSIS — E782 Mixed hyperlipidemia: Secondary | ICD-10-CM

## 2020-04-03 DIAGNOSIS — J4520 Mild intermittent asthma, uncomplicated: Secondary | ICD-10-CM

## 2020-04-03 DIAGNOSIS — L4050 Arthropathic psoriasis, unspecified: Secondary | ICD-10-CM | POA: Diagnosis not present

## 2020-04-03 DIAGNOSIS — J309 Allergic rhinitis, unspecified: Secondary | ICD-10-CM

## 2020-04-03 MED ORDER — PREDNISONE 20 MG PO TABS: 40.0000 mg | ORAL_TABLET | Freq: Every day | ORAL | 0 refills | Status: DC

## 2020-04-03 MED ORDER — AZELASTINE HCL 0.1 % NA SOLN: 1.0000 | Freq: Two times a day (BID) | NASAL | 2 refills | Status: DC

## 2020-04-03 MED ORDER — MELOXICAM 7.5 MG PO TABS: 7.5000 mg | ORAL_TABLET | Freq: Every day | ORAL | 2 refills | Status: DC | PRN

## 2020-04-03 MED ORDER — FLUTICASONE PROPIONATE 50 MCG/ACT NA SUSP: 2.0000 | Freq: Two times a day (BID) | NASAL | 6 refills | Status: DC

## 2020-04-03 MED ORDER — GEMFIBROZIL 600 MG PO TABS: 600.0000 mg | ORAL_TABLET | Freq: Two times a day (BID) | ORAL | 1 refills | Status: DC

## 2020-04-03 MED ORDER — CETIRIZINE HCL 10 MG PO TABS: 10.0000 mg | ORAL_TABLET | Freq: Every day | ORAL | 11 refills | Status: DC

## 2020-04-03 NOTE — Assessment & Plan Note (Signed)
Back on lopid, will recheck lipids and adjust if needed. Continue lifestyle modifications

## 2020-04-03 NOTE — Assessment & Plan Note (Signed)
Stable and under very good control, continue current regimen

## 2020-04-03 NOTE — Assessment & Plan Note (Signed)
Stable and well controlled, continue current regimen 

## 2020-04-03 NOTE — Progress Notes (Signed)
BP 134/84   Pulse 75   Temp 98.6 F (37 C)   Ht 5' 4.49" (1.638 m)   Wt 161 lb (73 kg)   SpO2 95%   BMI 27.22 kg/m    Subjective:    Patient ID: Benjamin Boone, male    DOB: 1956/01/31, 64 y.o.   MRN: [id number]  HPI: Benjamin Boone is a 64 y.o. male  Chief Complaint  Patient presents with  . Hyperlipidemia   Presenting today for 6 month f/u chronic conditions.   Joint soreness and stiffness in the mornings the past month or so. Started a different type of work now but does not feel that's causing the issue as things aren't getting better. No joint swelling, redness, fevers, rashes. Known hx of Psoriatic arthritis, previously followed by specialist at [facility] and treated with prn steroids. Taking OTC NSAIDs and tylenol prn.  Allergies continue to be much better controlled with current regimen. Not getting nearly as many sinus infections as he was previously and overall doing very well. Denies any current sxs.   Restarted the lopid for HLD and doing well. Had stopped it wondering if that was causing his joint pains but this didn't affect the pain. Trying to eat well and stay active additionally. Denies CP, SOB, claudication, myalgias.   Relevant past medical, surgical, family and social history reviewed and updated as indicated. Interim medical history since our last visit reviewed. Allergies and medications reviewed and updated.  Review of Systems  Per HPI unless specifically indicated above     Objective:    BP 134/84   Pulse 75   Temp 98.6 F (37 C)   Ht 5' 4.49" (1.638 m)   Wt 161 lb (73 kg)   SpO2 95%   BMI 27.22 kg/m   Wt Readings from Last 3 Encounters:  04/03/20 161 lb (73 kg)  12/01/19 165 lb (74.8 kg)  10/04/19 169 lb (76.7 kg)    Physical Exam Vitals and nursing note reviewed.  Constitutional:      Appearance: Normal appearance.  HENT:     Head: Atraumatic.  Eyes:     Extraocular Movements: Extraocular movements intact.   Conjunctiva/sclera: Conjunctivae normal.  Cardiovascular:     Rate and Rhythm: Normal rate and regular rhythm.  Pulmonary:     Effort: Pulmonary effort is normal.     Breath sounds: Normal breath sounds.  Musculoskeletal:        General: No swelling, tenderness or deformity. Normal range of motion.     Cervical back: Normal range of motion and neck supple.     Right lower leg: No edema.     Left lower leg: No edema.  Skin:    General: Skin is warm and dry.  Neurological:     General: No focal deficit present.     Mental Status: He is oriented to person, place, and time.  Psychiatric:        Mood and Affect: Mood normal.        Thought Content: Thought content normal.        Judgment: Judgment normal.     Results for orders placed or performed in visit on 10/04/19  CBC with Differential/Platelet out  Result Value Ref Range   WBC 7.8 3.4 - 10.8 x10E3/uL   RBC 5.27 4.14 - 5.80 x10E6/uL   Hemoglobin 15.1 13.0 - 17.7 g/dL   Hematocrit 87.8 67.6 - 51.0 %   MCV 84 79 - 97 fL  MCH 28.7 26.6 - 33.0 pg   MCHC 34.2 31.5 - 35.7 g/dL   RDW 67.8 93.8 - 10.1 %   Platelets 327 150 - 450 x10E3/uL   Neutrophils 55 Not Estab. %   Lymphs 28 Not Estab. %   Monocytes 9 Not Estab. %   Eos 7 Not Estab. %   Basos 1 Not Estab. %   Neutrophils Absolute 4.2 1.4 - 7.0 x10E3/uL   Lymphocytes Absolute 2.1 0.7 - 3.1 x10E3/uL   Monocytes Absolute 0.7 0.1 - 0.9 x10E3/uL   EOS (ABSOLUTE) 0.6 (H) 0.0 - 0.4 x10E3/uL   Basophils Absolute 0.1 0.0 - 0.2 x10E3/uL   Immature Granulocytes 0 Not Estab. %   Immature Grans (Abs) 0.0 0.0 - 0.1 x10E3/uL  Comprehensive metabolic panel  Result Value Ref Range   Glucose 83 65 - 99 mg/dL   BUN 16 8 - 27 mg/dL   Creatinine, Ser 7.51 0.76 - 1.27 mg/dL   GFR calc non Af Amer 79 >59 mL/min/1.73   GFR calc Af Amer 91 >59 mL/min/1.73   BUN/Creatinine Ratio 16 10 - 24   Sodium 137 134 - 144 mmol/L   Potassium 4.5 3.5 - 5.2 mmol/L   Chloride 100 96 - 106 mmol/L   CO2  24 20 - 29 mmol/L   Calcium 10.0 8.6 - 10.2 mg/dL   Total Protein 7.1 6.0 - 8.5 g/dL   Albumin 4.4 3.8 - 4.8 g/dL   Globulin, Total 2.7 1.5 - 4.5 g/dL   Albumin/Globulin Ratio 1.6 1.2 - 2.2   Bilirubin Total 0.2 0.0 - 1.2 mg/dL   Alkaline Phosphatase 97 39 - 117 IU/L   AST 20 0 - 40 IU/L   ALT 23 0 - 44 IU/L  Lipid Panel w/o Chol/HDL Ratio out  Result Value Ref Range   Cholesterol, Total 223 (H) 100 - 199 mg/dL   Triglycerides 025 (H) 0 - 149 mg/dL   HDL 33 (L) >85 mg/dL   VLDL Cholesterol Cal 75 (H) 5 - 40 mg/dL   LDL Chol Calc (NIH) 277 (H) 0 - 99 mg/dL  UA/M w/rflx Culture, Routine   Specimen: Urine   URINE  Result Value Ref Range   Specific Gravity, UA 1.015 1.005 - 1.030   pH, UA 5.0 5.0 - 7.5   Color, UA Yellow Yellow   Appearance Ur Clear Clear   Leukocytes,UA Negative Negative   Protein,UA Negative Negative/Trace   Glucose, UA Negative Negative   Ketones, UA Negative Negative   RBC, UA Negative Negative   Bilirubin, UA Negative Negative   Urobilinogen, Ur 0.2 0.2 - 1.0 mg/dL   Nitrite, UA Negative Negative  PSA  Result Value Ref Range   Prostate Specific Ag, Serum 0.9 0.0 - 4.0 ng/mL      Assessment & Plan:   Problem List Items Addressed This Visit      Respiratory   Allergic rhinitis    Stable and under very good control, continue current regimen      Asthma    Stable and well controlled, continue current regimen      Relevant Medications   predniSONE (DELTASONE) 20 MG tablet     Musculoskeletal and Integument   Psoriatic arthritis (HCC)    Increased joint pains and morning stiffness recently, will start prednisone burst and then take prn meloxicam in addition to tylenol. Anti inflammatory diet recommended, tumeric and other anti inflammatory supplements reviewed.       Relevant Medications   predniSONE (DELTASONE) 20  MG tablet   meloxicam (MOBIC) 7.5 MG tablet     Other   Hyperlipidemia - Primary    Back on lopid, will recheck lipids and  adjust if needed. Continue lifestyle modifications      Relevant Medications   gemfibrozil (LOPID) 600 MG tablet   Other Relevant Orders   Comprehensive metabolic panel   Lipid Panel w/o Chol/HDL Ratio       Follow up plan: Return in about 6 months (around 10/04/2020) for CPE.

## 2020-04-03 NOTE — Assessment & Plan Note (Signed)
Increased joint pains and morning stiffness recently, will start prednisone burst and then take prn meloxicam in addition to tylenol. Anti inflammatory diet recommended, tumeric and other anti inflammatory supplements reviewed.

## 2020-04-04 LAB — COMPREHENSIVE METABOLIC PANEL
ALT: 20 IU/L (ref 0–44)
AST: 18 IU/L (ref 0–40)
Albumin/Globulin Ratio: 1.3 (ref 1.2–2.2)
Albumin: 3.9 g/dL (ref 3.8–4.8)
Alkaline Phosphatase: 102 IU/L (ref 39–117)
BUN/Creatinine Ratio: 15 (ref 10–24)
BUN: 15 mg/dL (ref 8–27)
Bilirubin Total: 0.3 mg/dL (ref 0.0–1.2)
CO2: 23 mmol/L (ref 20–29)
Calcium: 9.5 mg/dL (ref 8.6–10.2)
Chloride: 105 mmol/L (ref 96–106)
Creatinine, Ser: 0.97 mg/dL (ref 0.76–1.27)
GFR calc Af Amer: 97 mL/min/{1.73_m2} (ref >59)
GFR calc non Af Amer: 84 mL/min/{1.73_m2} (ref >59)
Globulin, Total: 3.1 g/dL (ref 1.5–4.5)
Glucose: 76 mg/dL (ref 65–99)
Potassium: 4.4 mmol/L (ref 3.5–5.2)
Sodium: 142 mmol/L (ref 134–144)
Total Protein: 7 g/dL (ref 6.0–8.5)

## 2020-04-04 LAB — LIPID PANEL W/O CHOL/HDL RATIO
Cholesterol, Total: 175 mg/dL (ref 100–199)
HDL: 35 mg/dL — ABNORMAL LOW (ref >39)
LDL Chol Calc (NIH): 107 mg/dL — ABNORMAL HIGH (ref 0–99)
Triglycerides: 191 mg/dL — ABNORMAL HIGH (ref 0–149)
VLDL Cholesterol Cal: 33 mg/dL (ref 5–40)

## 2020-07-15 ENCOUNTER — Encounter: Payer: Self-pay | Admitting: Family Medicine

## 2020-07-15 ENCOUNTER — Telehealth (INDEPENDENT_AMBULATORY_CARE_PROVIDER_SITE_OTHER): Payer: 59 | Admitting: Family Medicine

## 2020-07-15 VITALS — Wt 157.0 lb

## 2020-07-15 DIAGNOSIS — J329 Chronic sinusitis, unspecified: Secondary | ICD-10-CM

## 2020-07-15 MED ORDER — AMOXICILLIN-POT CLAVULANATE 875-125 MG PO TABS: 1.0000 | ORAL_TABLET | Freq: Two times a day (BID) | ORAL | 0 refills | Status: DC

## 2020-07-15 NOTE — Progress Notes (Signed)
Wt 157 lb (71.2 kg)   BMI 26.54 kg/m    Subjective:    Patient ID: Benjamin Boone, male    DOB: 08/20/1956, 64 y.o.   MRN: [id number]  HPI: Benjamin Boone is a 64 y.o. male  Chief Complaint  Patient presents with  . Nasal Congestion    x 2 weeks. has tried OTC mucinex  . Cough    . This visit was completed via telephone due to the restrictions of the COVID-19 pandemic. All issues as above were discussed and addressed. Physical exam was done as above through visual confirmation on telephone. If it was felt that the patient should be evaluated in the office, they were directed there. The patient verbally consented to this visit. . Location of the patient: home . Location of the provider: work . Those involved with this call:  . Provider: Roosvelt Maser, PA-C . CMA: Elton Sin, CMA . Front Desk/Registration: Harriet Pho  . Time spent on call: 15 minutes on the phone discussing health concerns. 5 minutes total spent in review of patient's record and preparation of their chart. I verified patient identity using two factors (patient name and date of birth). Patient consents verbally to being seen via telemedicine visit today.   Congestion, sinus pain and pressure, productive cough for about 2 weeks. Denies fever, chills, CP, SOB, N/V/D, sick contacts, recent travel. Hx of allergic rhinitis on antihistamine and 2 nasal sprays.   Relevant past medical, surgical, family and social history reviewed and updated as indicated. Interim medical history since our last visit reviewed. Allergies and medications reviewed and updated.  Review of Systems  Per HPI unless specifically indicated above     Objective:    Wt 157 lb (71.2 kg)   BMI 26.54 kg/m   Wt Readings from Last 3 Encounters:  07/15/20 157 lb (71.2 kg)  04/03/20 161 lb (73 kg)  12/01/19 165 lb (74.8 kg)    Physical Exam  Unable to perform PE due to technical difficulties with video platform for today's  visit  Results for orders placed or performed in visit on 04/03/20  Comprehensive metabolic panel  Result Value Ref Range   Glucose 76 65 - 99 mg/dL   BUN 15 8 - 27 mg/dL   Creatinine, Ser 9.38 0.76 - 1.27 mg/dL   GFR calc non Af Amer 84 >59 mL/min/1.73   GFR calc Af Amer 97 >59 mL/min/1.73   BUN/Creatinine Ratio 15 10 - 24   Sodium 142 134 - 144 mmol/L   Potassium 4.4 3.5 - 5.2 mmol/L   Chloride 105 96 - 106 mmol/L   CO2 23 20 - 29 mmol/L   Calcium 9.5 8.6 - 10.2 mg/dL   Total Protein 7.0 6.0 - 8.5 g/dL   Albumin 3.9 3.8 - 4.8 g/dL   Globulin, Total 3.1 1.5 - 4.5 g/dL   Albumin/Globulin Ratio 1.3 1.2 - 2.2   Bilirubin Total 0.3 0.0 - 1.2 mg/dL   Alkaline Phosphatase 102 39 - 117 IU/L   AST 18 0 - 40 IU/L   ALT 20 0 - 44 IU/L  Lipid Panel w/o Chol/HDL Ratio  Result Value Ref Range   Cholesterol, Total 175 100 - 199 mg/dL   Triglycerides 101 (H) 0 - 149 mg/dL   HDL 35 (L) >75 mg/dL   VLDL Cholesterol Cal 33 5 - 40 mg/dL   LDL Chol Calc (NIH) 102 (H) 0 - 99 mg/dL      Assessment & Plan:  Problem List Items Addressed This Visit      Respiratory   Recurrent sinusitis - Primary    Tx with augmentin, mucinex, sinus rinses, continued allergy regimen. F/u if not resolving      Relevant Medications   amoxicillin-clavulanate (AUGMENTIN) 875-125 MG tablet       Follow up plan: Return if symptoms worsen or fail to improve.

## 2020-07-18 NOTE — Assessment & Plan Note (Signed)
Tx with augmentin, mucinex, sinus rinses, continued allergy regimen. F/u if not resolving

## 2020-07-23 ENCOUNTER — Telehealth: Payer: Self-pay | Admitting: Family Medicine

## 2020-07-23 ENCOUNTER — Other Ambulatory Visit: Payer: Self-pay | Admitting: Nurse Practitioner

## 2020-07-23 MED ORDER — AMOXICILLIN-POT CLAVULANATE 875-125 MG PO TABS: 1.0000 | ORAL_TABLET | Freq: Two times a day (BID) | ORAL | 0 refills | Status: DC

## 2020-07-23 NOTE — Telephone Encounter (Signed)
Routing to provider to advise.  

## 2020-07-23 NOTE — Telephone Encounter (Signed)
Will extend for 7 days, but if ongoing symptoms after this will need to be seen in office.  Thank you

## 2020-07-23 NOTE — Telephone Encounter (Signed)
Pt was seen by Fleet Contras on 8/23 and was given antibiotic for sinus issues and congestion and said to call back if a repeat was necessary, pt says sinus are a little better but congestion is not much better. Wants a refill, told pt that Fleet Contras was not at practice any more and we would reach back out to him. [phone] amoxicillin-clavulanate (AUGMENTIN) 875-125 MG tablet Medication Date: 07/15/2020 Department: [facility] Ordering/Authorizing: Particia Nearing, PA-C  Order Providers

## 2020-07-24 NOTE — Telephone Encounter (Signed)
Patient notified of [name]'s message and verbalized understanding.  ?

## 2020-10-04 ENCOUNTER — Encounter: Payer: 59 | Admitting: Nurse Practitioner

## 2020-10-04 ENCOUNTER — Encounter: Payer: 59 | Admitting: Family Medicine

## 2020-10-04 NOTE — Progress Notes (Signed)
BP 111/75    Pulse 80    Temp 98.1 F (36.7 C) (Oral)    Ht 5' 3.2" (1.605 m)    Wt 159 lb 3.2 oz (72.2 kg)    SpO2 97%    BMI 28.02 kg/m    Subjective:    Patient ID: Benjamin Boone, male    DOB: 09/20/1956, 64 y.o.   MRN: [id number]  HPI: Benjamin CedarStephen Edward Creasy is a 64 y.o. male presenting on 10/07/2020 for comprehensive medical examination. 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  Duration: months Satisfied with sleep quality: no Difficulty falling asleep: yes Difficulty staying asleep: yes Waking a few hours after sleep onset: yes Early morning awakenings: yes Daytime hypersomnolence: yes Wakes feeling refreshed: no Good sleep hygiene: yes; watches tv, reads Apnea: no Snoring: yes; per wife Depressed/anxious mood: no Recent stress: no  Restless legs/nocturnal leg cramps: no Chronic pain/arthritis: no History of sleep study: no Treatments attempted:  Tylenol PM (helps)  HYPERLIPIDEMIA Currently taking lopid 600 mg twice daily. Hyperlipidemia status: controlled Satisfied with current treatment?  yes Side effects:  no Medication compliance: excellent compliance Past cholesterol meds: gemfibrozil Aspirin:  no The 10-year ASCVD risk score Denman George(Goff DC Jr., et al., 2013) is: 9%   Values used to calculate the score:     Age: [age] years     Sex: Male     Is Non-Hispanic African American: No     Diabetic: No     Tobacco smoker: No     Systolic Blood Pressure: 111 mmHg     Is BP treated: No     HDL Cholesterol: 35 mg/dL     Total Cholesterol: 175 mg/dL Chest pain:  no Coronary artery disease:  no Family history CAD:  yes Family history early CAD:  no   He currently lives with: wife; dog Interim Problems from his last visit: no  Depression Screen done today and results listed below:  [facility] 10/07/2020 10/04/2019 12/21/2017 10/20/2017 10/30/2015  Decreased Interest 0 0 0 0  0  Down, Depressed, Hopeless 0 0 0 0 0  PHQ - 2 Score 0 0 0 0 0  Altered sleeping - 1 0 - -  Tired, decreased energy - 0 0 - -  Change in appetite - 0 0 - -  Feeling bad or failure about yourself  - 0 0 - -  Trouble concentrating - 0 0 - -  Moving slowly or fidgety/restless - 0 0 - -  Suicidal thoughts - 0 0 - -  PHQ-9 Score - 1 0 - -   The patient does not have a history of falls. I did not complete a risk assessment for falls. A plan of care for falls was not documented.  Past Medical History:  Past Medical History:  Diagnosis Date   Allergy    Arthritis    Hyperlipidemia    Venous insufficiency     Surgical History:  Past Surgical History:  Procedure Laterality Date   TONSILLECTOMY     TONSILLECTOMY      Medications:  Current Outpatient Medications on File Prior to Visit  Medication Sig   albuterol (VENTOLIN HFA) 108 (90 Base) MCG/ACT inhaler TAKE 2 PUFFS BY MOUTH EVERY 6 HOURS AS NEEDED FOR WHEEZE OR SHORTNESS OF BREATH   azelastine (ASTELIN) 0.1 % nasal spray Place 1 spray into both nostrils 2 (two) times daily. Use in each nostril as directed  fluticasone (FLONASE) 50 MCG/ACT nasal spray Place 2 sprays into both nostrils 2 (two) times daily.   gemfibrozil (LOPID) 600 MG tablet Take 1 tablet (600 mg total) by mouth 2 (two) times daily before a meal.   naproxen (NAPROSYN) 500 MG tablet Take 500 mg by mouth 2 (two) times daily with a meal.   No current facility-administered medications on file prior to visit.    Allergies:  Allergies  Allergen Reactions   Doxycycline Rash    Social History:  Social History   Socioeconomic History   Marital status: Married    Spouse name: Not on file   Number of children: Not on file   Years of education: Not on file   Highest education level: Not on file  Occupational History   Not on file  Tobacco Use   Smoking status: Never Smoker   Smokeless tobacco: Never Used  Vaping Use   Vaping Use: Never  used  Substance and Sexual Activity   Alcohol use: No   Drug use: No   Sexual activity: Yes  Other Topics Concern   Not on file  Social History Narrative   Not on file   Social Determinants of Health   Financial Resource Strain:    Difficulty of Paying Living Expenses: Not on file  Food Insecurity:    Worried About Running Out of Food in the Last Year: Not on file   Ran Out of Food in the Last Year: Not on file  Transportation Needs:    Lack of Transportation (Medical): Not on file   Lack of Transportation (Non-Medical): Not on file  Physical Activity:    Days of Exercise per Week: Not on file   Minutes of Exercise per Session: Not on file  Stress:    Feeling of Stress : Not on file  Social Connections:    Frequency of Communication with Friends and Family: Not on file   Frequency of Social Gatherings with Friends and Family: Not on file   Attends Religious Services: Not on file   Active Member of Clubs or Organizations: Not on file   Attends Banker Meetings: Not on file   Marital Status: Not on file  Intimate Partner Violence:    Fear of Current or Ex-Partner: Not on file   Emotionally Abused: Not on file   Physically Abused: Not on file   Sexually Abused: Not on file   Social History   Tobacco Use  Smoking Status Never Smoker  Smokeless Tobacco Never Used   Social History   Substance and Sexual Activity  Alcohol Use No    Family History:  Family History  Problem Relation Age of Onset   Heart disease Father        MI   Heart disease Mother        pacemaker   Heart attack Maternal Uncle    Prostate cancer Paternal Uncle     Past medical history, surgical history, medications, allergies, family history and social history reviewed with patient today and changes made to appropriate areas of the chart.   Review of Systems  Constitutional: Negative for fever, malaise/fatigue and weight loss.  HENT: Negative.   Negative for ear pain and sore throat.   Eyes: Negative.  Negative for blurred vision and double vision.  Respiratory: Negative.  Negative for cough, shortness of breath and wheezing.   Cardiovascular: Negative.  Negative for chest pain, palpitations and leg swelling.  Gastrointestinal: Negative.  Negative for constipation, diarrhea, nausea  and vomiting.  Genitourinary: Negative.   Musculoskeletal: Negative.   Skin: Negative.  Negative for itching and rash.  Neurological: Negative.  Negative for dizziness, weakness and headaches.  Psychiatric/Behavioral: Negative for depression. The patient has insomnia. The patient is not nervous/anxious.    All other ROS negative except what is listed above and in the HPI.      Objective:    BP 111/75    Pulse 80    Temp 98.1 F (36.7 C) (Oral)    Ht 5' 3.2" (1.605 m)    Wt 159 lb 3.2 oz (72.2 kg)    SpO2 97%    BMI 28.02 kg/m   Wt Readings from Last 3 Encounters:  10/07/20 159 lb 3.2 oz (72.2 kg)  07/15/20 157 lb (71.2 kg)  04/03/20 161 lb (73 kg)    Physical Exam Vitals and nursing note reviewed.  Constitutional:      General: He is not in acute distress.    Appearance: Normal appearance. He is normal weight. He is not toxic-appearing.  HENT:     Head: Normocephalic.     Right Ear: Tympanic membrane, ear canal and external ear normal.     Left Ear: Tympanic membrane, ear canal and external ear normal.     Nose: Nose normal. No congestion.     Mouth/Throat:     Mouth: Mucous membranes are moist.     Pharynx: Oropharynx is clear. No oropharyngeal exudate or posterior oropharyngeal erythema.  Eyes:     General: No scleral icterus.       Right eye: No discharge.        Left eye: No discharge.     Extraocular Movements: Extraocular movements intact.     Pupils: Pupils are equal, round, and reactive to light.  Neck:     Vascular: No carotid bruit.  Cardiovascular:     Rate and Rhythm: Normal rate and regular rhythm.     Heart sounds:  Normal heart sounds. No murmur heard.  No gallop.   Pulmonary:     Effort: Pulmonary effort is normal. No respiratory distress.     Breath sounds: Normal breath sounds. No wheezing or rhonchi.  Abdominal:     General: Abdomen is flat. Bowel sounds are normal. There is no distension.     Palpations: Abdomen is soft.     Tenderness: There is no abdominal tenderness.  Genitourinary:    Comments: Deferred using shared decision making Musculoskeletal:        General: No swelling or tenderness. Normal range of motion.     Cervical back: Normal range of motion and neck supple. No tenderness.     Right lower leg: No edema.     Left lower leg: No edema.  Skin:    General: Skin is warm and dry.     Coloration: Skin is not jaundiced.     Findings: No lesion.  Neurological:     General: No focal deficit present.     Mental Status: He is alert and oriented to person, place, and time. Mental status is at baseline.  Psychiatric:        Mood and Affect: Mood normal.        Behavior: Behavior normal.        Thought Content: Thought content normal.        Judgment: Judgment normal.       Assessment & Plan:   Problem List Items Addressed This Visit      Cardiovascular and  Mediastinum   Dilated aortic root (HCC)    Has followed with Cardiology in past - last Echocardiogram in 2018.  Will need follow up echocardiogram in next couple of years most likely.          Other   Hyperlipidemia    Chronic, previously stable with Lopid.  Will check lipids and CMP today, patient is fasting.  Continue Lopid for now.  Follow up in 6 months.      Relevant Orders   Lipid Panel w/o Chol/HDL Ratio   CBC with Differential/Platelet   Comprehensive metabolic panel   Sleep disturbance    Acute, ongoing.  Restless sleep possibly related to ?apnea with history of snoring vs. Poor sleep hygiene.  Will start nightly melatonin and instructed on good sleep hygiene.  With no improvement, may consider sleep study  vs. Prescription medication if desires.  If not better in about 1 month, return to clinic.       Other Visit Diagnoses    Annual physical exam    -  Primary   Relevant Orders   CBC with Differential/Platelet   Comprehensive metabolic panel   Screening for colon cancer       Relevant Orders   Ambulatory referral to Gastroenterology   Need for influenza vaccination       Relevant Orders   Flu Vaccine QUAD 36+ mos IM (Completed)   Screening for thyroid disorder       Relevant Orders   TSH   Need for hepatitis C screening test       Relevant Orders   Hepatitis C antibody   Screening for prostate cancer       Relevant Orders   PSA       Discussed aspirin prophylaxis for myocardial infarction prevention and decision was it was not indicated  LABORATORY TESTING:  Health maintenance labs ordered today as discussed above.   The natural history of prostate cancer and ongoing controversy regarding screening and potential treatment outcomes of prostate cancer has been discussed with the patient. The meaning of a false positive PSA and a false negative PSA has been discussed. He indicates understanding of the limitations of this screening test and wishes to proceed with screening PSA testing.   IMMUNIZATIONS:   - Tdap: Tetanus vaccination status reviewed: last tetanus booster within 10 years. - Influenza: Administered today - Pneumovax: Not applicable - Prevnar: Not applicable - HPV: Not applicable - Zostavax vaccine: Not applicable  SCREENING: - Colonoscopy: Ordered today  Discussed with patient purpose of the colonoscopy is to detect colon cancer at curable precancerous or early stages   - AAA Screening: Not applicable  -Hearing Test: Not applicable  -Spirometry: Not applicable   PATIENT COUNSELING:    Sexuality: Discussed sexually transmitted diseases, partner selection, use of condoms, avoidance of unintended pregnancy  and contraceptive alternatives.   Advised to avoid  cigarette smoking.  I discussed with the patient that most people either abstain from alcohol or drink within safe limits (<=14/week and <=4 drinks/occasion for males, <=7/weeks and <= 3 drinks/occasion for females) and that the risk for alcohol disorders and other health effects rises proportionally with the number of drinks per week and how often a drinker exceeds daily limits.  Discussed cessation/primary prevention of drug use and availability of treatment for abuse.   Diet: Encouraged to adjust caloric intake to maintain  or achieve ideal body weight, to reduce intake of dietary saturated fat and total fat, to limit sodium intake by avoiding  high sodium foods and not adding table salt, and to maintain adequate dietary potassium and calcium preferably from fresh fruits, vegetables, and low-fat dairy products.    stressed the importance of regular exercise  Injury prevention: Discussed safety belts, safety helmets, smoke detector, smoking near bedding or upholstery.   Dental health: Discussed importance of regular tooth brushing, flossing, and dental visits.   Follow up plan: NEXT PREVENTATIVE PHYSICAL DUE IN 1 YEAR. Return in about 6 months (around 04/06/2021) for 6 month f/u.

## 2020-10-07 ENCOUNTER — Encounter: Payer: Self-pay | Admitting: Nurse Practitioner

## 2020-10-07 ENCOUNTER — Other Ambulatory Visit: Payer: Self-pay

## 2020-10-07 ENCOUNTER — Encounter: Payer: 59 | Admitting: Nurse Practitioner

## 2020-10-07 ENCOUNTER — Ambulatory Visit (INDEPENDENT_AMBULATORY_CARE_PROVIDER_SITE_OTHER): Payer: 59 | Admitting: Nurse Practitioner

## 2020-10-07 VITALS — BP 111/75 | HR 80 | Temp 98.1°F | Ht 63.2 in | Wt 159.2 lb

## 2020-10-07 DIAGNOSIS — I77810 Thoracic aortic ectasia: Secondary | ICD-10-CM

## 2020-10-07 DIAGNOSIS — E782 Mixed hyperlipidemia: Secondary | ICD-10-CM

## 2020-10-07 DIAGNOSIS — Z1211 Encounter for screening for malignant neoplasm of colon: Secondary | ICD-10-CM | POA: Diagnosis not present

## 2020-10-07 DIAGNOSIS — Z1159 Encounter for screening for other viral diseases: Secondary | ICD-10-CM

## 2020-10-07 DIAGNOSIS — Z23 Encounter for immunization: Secondary | ICD-10-CM

## 2020-10-07 DIAGNOSIS — Z0000 Encounter for general adult medical examination without abnormal findings: Secondary | ICD-10-CM | POA: Diagnosis not present

## 2020-10-07 DIAGNOSIS — Z1329 Encounter for screening for other suspected endocrine disorder: Secondary | ICD-10-CM

## 2020-10-07 DIAGNOSIS — G479 Sleep disorder, unspecified: Secondary | ICD-10-CM | POA: Diagnosis not present

## 2020-10-07 DIAGNOSIS — Z125 Encounter for screening for malignant neoplasm of prostate: Secondary | ICD-10-CM

## 2020-10-07 NOTE — Assessment & Plan Note (Addendum)
Acute, ongoing.  Restless sleep possibly related to ?apnea with history of snoring vs. Poor sleep hygiene.  Will start nightly melatonin and instructed on good sleep hygiene.  With no improvement, may consider sleep study vs. Prescription medication if desires.  If not better in about 1 month, return to clinic.

## 2020-10-07 NOTE — Assessment & Plan Note (Addendum)
Has followed with Cardiology in past - last Echocardiogram in 2018.  Will need follow up echocardiogram in next couple of years most likely.   

## 2020-10-07 NOTE — Patient Instructions (Addendum)
Benjamin Boone, Start an over-the-counter supplement called Melatonin to help with your sleep.  If this does not help, come back and see [place] in the next month or so.   Also, try to have good sleep hygiene with calming down at nighttime, reading a book or magazine and limiting time in front of screens including the TV and cell phone.  Influenza (Flu) Vaccine (Inactivated or Recombinant): What You Need to Know 1. Why get vaccinated? Influenza vaccine can prevent influenza (flu). Flu is a contagious disease that spreads around the [place] every year, usually between October and May. Anyone can get the flu, but it is more dangerous for some people. Infants and young children, people 91 years of age and older, pregnant women, and people with certain health conditions or a weakened immune system are at greatest risk of flu complications. Pneumonia, bronchitis, sinus infections and ear infections are examples of flu-related complications. If you have a medical condition, such as heart disease, cancer or diabetes, flu can make it worse. Flu can cause fever and chills, sore throat, muscle aches, fatigue, cough, headache, and runny or stuffy nose. Some people may have vomiting and diarrhea, though this is more common in children than adults. Each year thousands of people in the [place] die from flu, and many more are hospitalized. Flu vaccine prevents millions of illnesses and flu-related visits to the doctor each year. 2. Influenza vaccine CDC recommends everyone 77 months of age and older get vaccinated every flu season. Children 6 months through 60 years of age may need 2 doses during a single flu season. Everyone else needs only 1 dose each flu season. It takes about 2 weeks for protection to develop after vaccination. There are many flu viruses, and they are always changing. Each year a new flu vaccine is made to protect against three or four viruses that are likely to cause disease in the upcoming flu  season. Even when the vaccine doesn't exactly match these viruses, it may still provide some protection. Influenza vaccine does not cause flu. Influenza vaccine may be given at the same time as other vaccines. 3. Talk with your health care provider Tell your vaccine provider if the person getting the vaccine:  Has had an allergic reaction after a previous dose of influenza vaccine, or has any severe, life-threatening allergies.  Has ever had Guillain-Barr Syndrome (also called GBS). In some cases, your health care provider may decide to postpone influenza vaccination to a future visit. People with minor illnesses, such as a cold, may be vaccinated. People who are moderately or severely ill should usually wait until they recover before getting influenza vaccine. Your health care provider can give you more information. 4. Risks of a vaccine reaction  Soreness, redness, and swelling where shot is given, fever, muscle aches, and headache can happen after influenza vaccine.  There may be a very small increased risk of Guillain-Barr Syndrome (GBS) after inactivated influenza vaccine (the flu shot). Young children who get the flu shot along with pneumococcal vaccine (PCV13), and/or DTaP vaccine at the same time might be slightly more likely to have a seizure caused by fever. Tell your health care provider if a child who is getting flu vaccine has ever had a seizure. People sometimes faint after medical procedures, including vaccination. Tell your provider if you feel dizzy or have vision changes or ringing in the ears. As with any medicine, there is a very remote chance of a vaccine causing a severe allergic reaction, other  serious injury, or death. 5. What if there is a serious problem? An allergic reaction could occur after the vaccinated person leaves the clinic. If you see signs of a severe allergic reaction (hives, swelling of the face and throat, difficulty breathing, a fast heartbeat,  dizziness, or weakness), call 9-1-1 and get the person to the nearest hospital. For other signs that concern you, call your health care provider. Adverse reactions should be reported to the Vaccine Adverse Event Reporting System (VAERS). Your health care provider will usually file this report, or you can do it yourself. Visit the VAERS website at www.vaers.SamedayNews.es or call 262-622-6077.VAERS is only for reporting reactions, and VAERS staff do not give medical advice. 6. The National Vaccine Injury Compensation Program The [employer] Vaccine Injury Compensation Program (VICP) is a federal program that was created to compensate people who may have been injured by certain vaccines. Visit the VICP website at GoldCloset.com.ee or call 917-582-5127 to learn about the program and about filing a claim. There is a time limit to file a claim for compensation. 7. How can I learn more?  Ask your healthcare provider.  Call your local or state health department.  Contact the Centers for Disease Control and Prevention (CDC): ? Call 604-541-2681 (1-800-CDC-INFO) or ? Visit CDC's https://gibson.com/ Vaccine Information Statement (Interim) Inactivated Influenza Vaccine (07/07/2018) This information is not intended to replace advice given to you by your health care provider. Make sure you discuss any questions you have with your health care provider. Document Revised: 02/28/2019 Document Reviewed: 07/11/2018 Elsevier Patient Education  2020 Elsevier Inc.  Preventive Care 70-36 Years Old, Male Preventive care refers to lifestyle choices and visits with your health care provider that can promote health and wellness. This includes:  A yearly physical exam. This is also called an annual well check.  Regular dental and eye exams.  Immunizations.  Screening for certain conditions.  Healthy lifestyle choices, such as eating a healthy diet, getting regular exercise, not using drugs or products that  contain nicotine and tobacco, and limiting alcohol use. What can I expect for my preventive care visit? Physical exam Your health care provider will check:  Height and weight. These may be used to calculate body mass index (BMI), which is a measurement that tells if you are at a healthy weight.  Heart rate and blood pressure.  Your skin for abnormal spots. Counseling Your health care provider may ask you questions about:  Alcohol, tobacco, and drug use.  Emotional well-being.  Home and relationship well-being.  Sexual activity.  Eating habits.  Work and work Statistician. What immunizations do I need?  Influenza (flu) vaccine  This is recommended every year. Tetanus, diphtheria, and pertussis (Tdap) vaccine  You may need a Td booster every 10 years. Varicella (chickenpox) vaccine  You may need this vaccine if you have not already been vaccinated. Zoster (shingles) vaccine  You may need this after age 52. Measles, mumps, and rubella (MMR) vaccine  You may need at least one dose of MMR if you were born in 1957 or later. You may also need a second dose. Pneumococcal conjugate (PCV13) vaccine  You may need this if you have certain conditions and were not previously vaccinated. Pneumococcal polysaccharide (PPSV23) vaccine  You may need one or two doses if you smoke cigarettes or if you have certain conditions. Meningococcal conjugate (MenACWY) vaccine  You may need this if you have certain conditions. Hepatitis A vaccine  You may need this if you have certain conditions  or if you travel or work in places where you may be exposed to hepatitis A. Hepatitis B vaccine  You may need this if you have certain conditions or if you travel or work in places where you may be exposed to hepatitis B. Haemophilus influenzae type b (Hib) vaccine  You may need this if you have certain risk factors. Human papillomavirus (HPV) vaccine  If recommended by your health care provider,  you may need three doses over 6 months. You may receive vaccines as individual doses or as more than one vaccine together in one shot (combination vaccines). Talk with your health care provider about the risks and benefits of combination vaccines. What tests do I need? Blood tests  Lipid and cholesterol levels. These may be checked every 5 years, or more frequently if you are over 12 years old.  Hepatitis C test.  Hepatitis B test. Screening  Lung cancer screening. You may have this screening every year starting at age 33 if you have a 30-pack-year history of smoking and currently smoke or have quit within the past 15 years.  Prostate cancer screening. Recommendations will vary depending on your family history and other risks.  Colorectal cancer screening. All adults should have this screening starting at age 56 and continuing until age 54. Your health care provider may recommend screening at age 2 if you are at increased risk. You will have tests every 1-10 years, depending on your results and the type of screening test.  Diabetes screening. This is done by checking your blood sugar (glucose) after you have not eaten for a while (fasting). You may have this done every 1-3 years.  Sexually transmitted disease (STD) testing. Follow these instructions at home: Eating and drinking  Eat a diet that includes fresh fruits and vegetables, whole grains, lean protein, and low-fat dairy products.  Take vitamin and mineral supplements as recommended by your health care provider.  Do not drink alcohol if your health care provider tells you not to drink.  If you drink alcohol: ? Limit how much you have to 0-2 drinks a day. ? Be aware of how much alcohol is in your drink. In the [place] one drink equals one 12 oz bottle of beer (355 mL), one 5 oz glass of wine (148 mL), or one 1 oz glass of hard liquor (44 mL). Lifestyle  Take daily care of your teeth and gums.  Stay active. Exercise for at  least 30 minutes on 5 or more days each week.  Do not use any products that contain nicotine or tobacco, such as cigarettes, e-cigarettes, and chewing tobacco. If you need help quitting, ask your health care provider.  If you are sexually active, practice safe sex. Use a condom or other form of protection to prevent STIs (sexually transmitted infections).  Talk with your health care provider about taking a low-dose aspirin every day starting at age 41. What's next?  Go to your health care provider once a year for a well check visit.  Ask your health care provider how often you should have your eyes and teeth checked.  Stay up to date on all vaccines. This information is not intended to replace advice given to you by your health care provider. Make sure you discuss any questions you have with your health care provider. Document Revised: 11/03/2018 Document Reviewed: 11/03/2018 Elsevier Patient Education  2020 [employer].

## 2020-10-07 NOTE — Assessment & Plan Note (Signed)
Chronic, previously stable with Lopid.  Will check lipids and CMP today, patient is fasting.  Continue Lopid for now.  Follow up in 6 months.

## 2020-10-09 LAB — CBC WITH DIFFERENTIAL/PLATELET
Basophils Absolute: 0.1 10*3/uL (ref 0.0–0.2)
Basos: 1 %
EOS (ABSOLUTE): 0.6 10*3/uL — ABNORMAL HIGH (ref 0.0–0.4)
Eos: 8 %
Hematocrit: 43.3 % (ref 37.5–51.0)
Hemoglobin: 14.5 g/dL (ref 13.0–17.7)
Immature Grans (Abs): 0 10*3/uL (ref 0.0–0.1)
Immature Granulocytes: 0 %
Lymphocytes Absolute: 1.7 10*3/uL (ref 0.7–3.1)
Lymphs: 23 %
MCH: 27.4 pg (ref 26.6–33.0)
MCHC: 33.5 g/dL (ref 31.5–35.7)
MCV: 82 fL (ref 79–97)
Monocytes Absolute: 0.6 10*3/uL (ref 0.1–0.9)
Monocytes: 9 %
Neutrophils Absolute: 4.3 10*3/uL (ref 1.4–7.0)
Neutrophils: 59 %
Platelets: 345 10*3/uL (ref 150–450)
RBC: 5.29 x10E6/uL (ref 4.14–5.80)
RDW: 14.5 % (ref 11.6–15.4)
WBC: 7.3 10*3/uL (ref 3.4–10.8)

## 2020-10-09 LAB — COMPREHENSIVE METABOLIC PANEL
ALT: 13 IU/L (ref 0–44)
AST: 18 IU/L (ref 0–40)
Albumin/Globulin Ratio: 1.8 (ref 1.2–2.2)
Albumin: 4.3 g/dL (ref 3.8–4.8)
Alkaline Phosphatase: 112 IU/L (ref 44–121)
BUN/Creatinine Ratio: 15 (ref 10–24)
BUN: 14 mg/dL (ref 8–27)
Bilirubin Total: <0.2 mg/dL (ref 0.0–1.2)
CO2: 18 mmol/L — ABNORMAL LOW (ref 20–29)
Calcium: 9.7 mg/dL (ref 8.6–10.2)
Chloride: 103 mmol/L (ref 96–106)
Creatinine, Ser: 0.92 mg/dL (ref 0.76–1.27)
GFR calc Af Amer: 103 mL/min/{1.73_m2} (ref >59)
GFR calc non Af Amer: 89 mL/min/{1.73_m2} (ref >59)
Globulin, Total: 2.4 g/dL (ref 1.5–4.5)
Glucose: 85 mg/dL (ref 65–99)
Potassium: 4.3 mmol/L (ref 3.5–5.2)
Sodium: 142 mmol/L (ref 134–144)
Total Protein: 6.7 g/dL (ref 6.0–8.5)

## 2020-10-09 LAB — LIPID PANEL W/O CHOL/HDL RATIO
Cholesterol, Total: 243 mg/dL — ABNORMAL HIGH (ref 100–199)
HDL: 36 mg/dL — ABNORMAL LOW (ref >39)
LDL Chol Calc (NIH): 160 mg/dL — ABNORMAL HIGH (ref 0–99)
Triglycerides: 253 mg/dL — ABNORMAL HIGH (ref 0–149)
VLDL Cholesterol Cal: 47 mg/dL — ABNORMAL HIGH (ref 5–40)

## 2020-10-09 LAB — HEPATITIS C ANTIBODY: Hep C Virus Ab: <0.1 s/co ratio (ref 0.0–0.9)

## 2020-10-09 LAB — PSA: Prostate Specific Ag, Serum: 1.5 ng/mL (ref 0.0–4.0)

## 2020-10-09 LAB — TSH: TSH: 3.55 u[IU]/mL (ref 0.450–4.500)

## 2020-10-14 ENCOUNTER — Telehealth (INDEPENDENT_AMBULATORY_CARE_PROVIDER_SITE_OTHER): Payer: Self-pay | Admitting: Gastroenterology

## 2020-10-14 ENCOUNTER — Other Ambulatory Visit: Payer: Self-pay

## 2020-10-14 DIAGNOSIS — Z1211 Encounter for screening for malignant neoplasm of colon: Secondary | ICD-10-CM

## 2020-10-14 MED ORDER — PEG 3350-KCL-NA BICARB-NACL 420 G PO SOLR: 4000.0000 mL | Freq: Once | ORAL | 0 refills | Status: AC

## 2020-10-14 NOTE — Progress Notes (Signed)
Gastroenterology Pre-Procedure Review  Request Date: Monday 12/09/20 Requesting Physician: Dr. Servando Snare  PATIENT REVIEW QUESTIONS: The patient responded to the following health history questions as indicated:    1. Are you having any GI issues? no 2. Do you have a personal history of Polyps? no 3. Do you have a family history of Colon Cancer or Polyps? no 4. Diabetes Mellitus? no 5. Joint replacements in the past 12 months?no 6. Major health problems in the past 3 months?no 7. Any artificial heart valves, MVP, or defibrillator?no    MEDICATIONS & ALLERGIES:    Patient reports the following regarding taking any anticoagulation/antiplatelet therapy:   Plavix, Coumadin, Eliquis, Xarelto, Lovenox, Pradaxa, Brilinta, or Effient? no Aspirin? no  Patient confirms/reports the following medications:  Current Outpatient Medications  Medication Sig Dispense Refill   albuterol (VENTOLIN HFA) 108 (90 Base) MCG/ACT inhaler TAKE 2 PUFFS BY MOUTH EVERY 6 HOURS AS NEEDED FOR WHEEZE OR SHORTNESS OF BREATH 18 g 2   azelastine (ASTELIN) 0.1 % nasal spray Place 1 spray into both nostrils 2 (two) times daily. Use in each nostril as directed 30 mL 2   fluticasone (FLONASE) 50 MCG/ACT nasal spray Place 2 sprays into both nostrils 2 (two) times daily. 16 mL 6   gemfibrozil (LOPID) 600 MG tablet Take 1 tablet (600 mg total) by mouth 2 (two) times daily before a meal. 180 tablet 1   naproxen (NAPROSYN) 500 MG tablet Take 500 mg by mouth 2 (two) times daily with a meal.     polyethylene glycol-electrolytes (NULYTELY) 420 g solution Take 4,000 mLs by mouth once for 1 dose. 4000 mL 0   No current facility-administered medications for this visit.    Patient confirms/reports the following allergies:  Allergies  Allergen Reactions   Doxycycline Rash    Orders Placed This Encounter  Procedures   Procedural/ Surgical Case Request: COLONOSCOPY WITH PROPOFOL    Standing Status:   Standing    Number of  Occurrences:   1    Order Specific Question:   Pre-op diagnosis    Answer:   screening colonoscopy    Order Specific Question:   CPT Code    Answer:   98921    AUTHORIZATION INFORMATION Primary Insurance: 1D#: Group #:  Secondary Insurance: 1D#: Group #:  SCHEDULE INFORMATION: Date: 12/09/20 Time: Location:[facility]

## 2020-11-29 ENCOUNTER — Encounter: Payer: Self-pay | Admitting: Gastroenterology

## 2020-12-02 ENCOUNTER — Encounter: Payer: Self-pay | Admitting: Anesthesiology

## 2020-12-05 ENCOUNTER — Other Ambulatory Visit: Payer: 59

## 2020-12-05 ENCOUNTER — Telehealth: Payer: Self-pay

## 2020-12-05 DIAGNOSIS — Z1211 Encounter for screening for malignant neoplasm of colon: Secondary | ICD-10-CM

## 2020-12-05 NOTE — Telephone Encounter (Signed)
Returned patients call in regard to r/s Monday 12/09/20 colonoscopy due to winter weather expected.  LVM asking him to call back, but noticed that he has already rescheduled with Debbie at [facility] for 12/16/20.  I've updated instructions to reflect date change, and new COVID test date.  Referral updated.  Thanks,  [place], [place]

## 2020-12-09 ENCOUNTER — Telehealth: Payer: Self-pay

## 2020-12-09 NOTE — Telephone Encounter (Signed)
lvm we had to cancel apt due to office opening at 10. APt still be virtual due to SX if calls back please reschedule.

## 2020-12-10 ENCOUNTER — Telehealth: Payer: 59 | Admitting: Nurse Practitioner

## 2020-12-10 ENCOUNTER — Ambulatory Visit: Payer: 59 | Admitting: Family Medicine

## 2020-12-12 ENCOUNTER — Other Ambulatory Visit
Admission: RE | Admit: 2020-12-12 | Discharge: 2020-12-12 | Disposition: A | Discharge status: Home / Self Care | Payer: 59 | Source: Ambulatory Visit | Attending: Gastroenterology | Admitting: Gastroenterology

## 2020-12-12 ENCOUNTER — Other Ambulatory Visit: Payer: Self-pay

## 2020-12-12 DIAGNOSIS — U071 COVID-19: Secondary | ICD-10-CM | POA: Diagnosis not present

## 2020-12-12 DIAGNOSIS — Z01812 Encounter for preprocedural laboratory examination: Secondary | ICD-10-CM | POA: Insufficient documentation

## 2020-12-12 LAB — SARS CORONAVIRUS 2 (TAT 6-24 HRS): SARS Coronavirus 2: POSITIVE — AB

## 2020-12-12 NOTE — Discharge Instructions (Signed)

## 2020-12-13 ENCOUNTER — Telehealth: Payer: Self-pay | Admitting: Nurse Practitioner

## 2020-12-13 NOTE — Telephone Encounter (Signed)
Called to discuss with patient about COVID-19 symptoms and the use of one of the available treatments for those with mild to moderate Covid symptoms and at a high risk of hospitalization.  Pt appears to qualify for outpatient treatment due to co-morbid conditions and/or a member of an at-risk group in accordance with the FDA Emergency Use Authorization.    Symptom onset: no symptoms Not qualified for treatment due to asymptomatic.    Benjamin Boone

## 2020-12-16 ENCOUNTER — Ambulatory Visit: Admission: RE | Admit: 2020-12-16 | Payer: 59 | Source: Home / Self Care | Admitting: Gastroenterology

## 2020-12-16 ENCOUNTER — Telehealth (INDEPENDENT_AMBULATORY_CARE_PROVIDER_SITE_OTHER): Payer: 59 | Admitting: Family Medicine

## 2020-12-16 ENCOUNTER — Encounter: Payer: Self-pay | Admitting: Family Medicine

## 2020-12-16 ENCOUNTER — Telehealth: Payer: 59 | Admitting: Family Medicine

## 2020-12-16 DIAGNOSIS — U071 COVID-19: Secondary | ICD-10-CM | POA: Diagnosis not present

## 2020-12-16 DIAGNOSIS — J329 Chronic sinusitis, unspecified: Secondary | ICD-10-CM

## 2020-12-16 SURGERY — COLONOSCOPY WITH PROPOFOL
Anesthesia: Choice

## 2020-12-16 MED ORDER — AMOXICILLIN-POT CLAVULANATE 875-125 MG PO TABS: 1.0000 | ORAL_TABLET | Freq: Two times a day (BID) | ORAL | 0 refills | Status: DC

## 2020-12-16 MED ORDER — PREDNISONE 50 MG PO TABS: 50.0000 mg | ORAL_TABLET | Freq: Every day | ORAL | 0 refills | Status: DC

## 2020-12-16 NOTE — Assessment & Plan Note (Signed)
Will treat with augmentin and prednisone. Likely needs follow up with ENT. Call with any concerns.

## 2020-12-16 NOTE — Progress Notes (Signed)
There were no vitals taken for this visit.   Subjective:    Patient ID: Benjamin Boone, male    DOB: 1956-05-15, 65 y.o.   MRN: [id number]  HPI: Benjamin Boone is a 65 y.o. male  Chief Complaint  Patient presents with  . Nasal Congestion    Patient states he has really bad left nasal congestion. Patient is using sinus rinse that helps but at night he feels very bad.    UPPER RESPIRATORY TRACT INFECTION Duration: 10-14 days Worst symptom: nasal congestion Fever: yes Cough: yes Shortness of breath: no Wheezing: no Chest pain: no Chest tightness: no Chest congestion: no Nasal congestion: yes Runny nose: yes Post nasal drip: no Sneezing: no Sore throat: no Swollen glands: no Sinus pressure: yes Headache: yes Face pain: yes Toothache: no Ear pain: no  Ear pressure: no  Eyes red/itching:no Eye drainage/crusting: no  Vomiting: no Rash: no Fatigue: no Sick contacts: no Strep contacts: no  Context: worse Recurrent sinusitis: yes Relief with OTC cold/cough medications: no  Treatments attempted: mucinex   Relevant past medical, surgical, family and social history reviewed and updated as indicated. Interim medical history since our last visit reviewed. Allergies and medications reviewed and updated.  Review of Systems  Constitutional: Positive for chills, diaphoresis and fever. Negative for activity change, appetite change, fatigue and unexpected weight change.  HENT: Positive for congestion, postnasal drip, rhinorrhea, sinus pressure and sinus pain. Negative for dental problem, drooling, ear discharge, ear pain, facial swelling, hearing loss, mouth sores, nosebleeds, sneezing, sore throat, tinnitus, trouble swallowing and voice change.   Respiratory: Positive for cough. Negative for apnea, choking, chest tightness, shortness of breath, wheezing and stridor.   Cardiovascular: Negative.   Gastrointestinal: Negative.   Musculoskeletal: Negative.    Psychiatric/Behavioral: Negative.     Per HPI unless specifically indicated above     Objective:    There were no vitals taken for this visit.  Wt Readings from Last 3 Encounters:  10/07/20 159 lb 3.2 oz (72.2 kg)  07/15/20 157 lb (71.2 kg)  04/03/20 161 lb (73 kg)    Physical Exam Vitals and nursing note reviewed.  Pulmonary:     Effort: Pulmonary effort is normal. No respiratory distress.     Comments: Speaking in full sentences Neurological:     Mental Status: He is alert.  Psychiatric:        Mood and Affect: Mood normal.        Behavior: Behavior normal.        Thought Content: Thought content normal.        Judgment: Judgment normal.     Results for orders placed or performed during the hospital encounter of 12/12/20  SARS CORONAVIRUS 2 (TAT 6-24 HRS) Nasopharyngeal Nasopharyngeal Swab   Specimen: Nasopharyngeal Swab  Result Value Ref Range   SARS Coronavirus 2 POSITIVE (A) NEGATIVE      Assessment & Plan:   Problem List Items Addressed This Visit      Respiratory   Recurrent sinusitis    Will treat with augmentin and prednisone. Likely needs follow up with ENT. Call with any concerns.      Relevant Medications   predniSONE (DELTASONE) 50 MG tablet   amoxicillin-clavulanate (AUGMENTIN) 875-125 MG tablet    Other Visit Diagnoses    COVID    -  Primary   Self-quarantine until 10 days after symptoms. Will treat symptomaticaly with prednisone. Call with any concerns.        Follow  up plan: Return May for 6 month follow up with Dr. Charlotta Newton.   . This visit was completed via telephone due to the restrictions of the COVID-19 pandemic. All issues as above were discussed and addressed but no physical exam was performed. If it was felt that the patient should be evaluated in the office, they were directed there. The patient verbally consented to this visit. Patient was unable to complete an audio/visual visit due to Lack of equipment. Due to the catastrophic  nature of the COVID-19 pandemic, this visit was done through audio contact only. . Location of the patient: home . Location of the provider: work . Those involved with this call:  . Provider: Olevia Perches, DO . CMA: Rolley Sims, CMA . Front Desk/Registration: Beverely Pace  . Time spent on call: 21 minutes on the phone discussing health concerns. 30 minutes total spent in review of patient's record and preparation of their chart.

## 2020-12-24 ENCOUNTER — Telehealth: Payer: Self-pay

## 2020-12-24 NOTE — Telephone Encounter (Signed)
May for 6 month follow up with Dr. Charlotta Newton

## 2020-12-24 NOTE — Telephone Encounter (Signed)
-----   Message from Dorcas Carrow, [place] sent at 12/16/2020 11:16 AM EST ----- May for 6 month follow up with Dr. Charlotta Newton

## 2020-12-26 NOTE — Telephone Encounter (Signed)
Lvm to maket this apt. °

## 2020-12-27 ENCOUNTER — Encounter: Payer: Self-pay | Admitting: Family Medicine

## 2020-12-27 NOTE — Telephone Encounter (Signed)
Lvm to make tthis apt sent letter

## 2021-05-20 ENCOUNTER — Other Ambulatory Visit: Payer: Self-pay

## 2021-05-20 DIAGNOSIS — Z1211 Encounter for screening for malignant neoplasm of colon: Secondary | ICD-10-CM

## 2021-05-21 ENCOUNTER — Encounter: Payer: Self-pay | Admitting: Gastroenterology

## 2021-06-01 NOTE — Anesthesia Preprocedure Evaluation (Addendum)
Anesthesia Evaluation  Patient identified by MRN, date of birth, ID band Patient awake    Reviewed: Allergy & Precautions, NPO status , Patient's Chart, lab work & pertinent test results  History of Anesthesia Complications Negative for: history of anesthetic complications  Airway Mallampati: III   Neck ROM: Full    Dental no notable dental hx.    Pulmonary  Hx COVID 12/12/20   Pulmonary exam normal breath sounds clear to auscultation       Cardiovascular Exercise Tolerance: Good negative cardio ROS Normal cardiovascular exam Rhythm:Regular Rate:Normal  Venous insufficiency  Myocardial perfusion 01/28/17:  Blood pressure demonstrated a normal response to exercise. There was no ST segment deviation noted during stress. No T wave inversion was noted during stress. The study is normal. This is a low risk study. The left ventricular ejection fraction is normal (55-65%).    Neuro/Psych negative neurological ROS     GI/Hepatic negative GI ROS,   Endo/Other  negative endocrine ROS  Renal/GU negative Renal ROS     Musculoskeletal  (+) Arthritis ,   Abdominal   Peds  Hematology negative hematology ROS (+)   Anesthesia Other Findings   Reproductive/Obstetrics                            Anesthesia Physical Anesthesia Plan  ASA: 2  Anesthesia Plan: General   Post-op Pain Management:    Induction: Intravenous  PONV Risk Score and Plan: 2 and Propofol infusion, TIVA and Treatment may vary due to age or medical condition  Airway Management Planned: Natural Airway  Additional Equipment:   Intra-op Plan:   Post-operative Plan:   Informed Consent: I have reviewed the patients History and Physical, chart, labs and discussed the procedure including the risks, benefits and alternatives for the proposed anesthesia with the patient or authorized representative who has indicated his/her  understanding and acceptance.       Plan Discussed with: CRNA  Anesthesia Plan Comments:        Anesthesia Quick Evaluation

## 2021-06-02 ENCOUNTER — Encounter: Payer: Self-pay | Admitting: Gastroenterology

## 2021-06-02 ENCOUNTER — Ambulatory Visit: Payer: 59 | Admitting: Anesthesiology

## 2021-06-02 ENCOUNTER — Ambulatory Visit
Admission: RE | Admit: 2021-06-02 | Discharge: 2021-06-02 | Disposition: A | Discharge status: Home / Self Care | Payer: 59 | Attending: Gastroenterology | Admitting: Gastroenterology

## 2021-06-02 ENCOUNTER — Other Ambulatory Visit: Payer: Self-pay

## 2021-06-02 ENCOUNTER — Encounter
Admission: RE | Disposition: A | Discharge status: Home / Self Care | Payer: Self-pay | Source: Home / Self Care | Attending: Gastroenterology

## 2021-06-02 DIAGNOSIS — Z1211 Encounter for screening for malignant neoplasm of colon: Secondary | ICD-10-CM | POA: Diagnosis present

## 2021-06-02 DIAGNOSIS — Z881 Allergy status to other antibiotic agents status: Secondary | ICD-10-CM | POA: Diagnosis not present

## 2021-06-02 DIAGNOSIS — Z79899 Other long term (current) drug therapy: Secondary | ICD-10-CM | POA: Diagnosis not present

## 2021-06-02 DIAGNOSIS — Z8616 Personal history of COVID-19: Secondary | ICD-10-CM | POA: Diagnosis not present

## 2021-06-02 DIAGNOSIS — K621 Rectal polyp: Secondary | ICD-10-CM

## 2021-06-02 HISTORY — PX: POLYPECTOMY: SHX5525

## 2021-06-02 HISTORY — PX: COLONOSCOPY WITH PROPOFOL: SHX5780

## 2021-06-02 SURGERY — COLONOSCOPY WITH PROPOFOL
Anesthesia: General | Site: Rectum

## 2021-06-02 MED ORDER — SODIUM CHLORIDE 0.9 % IV SOLN: INTRAVENOUS | Status: DC

## 2021-06-02 MED ORDER — LACTATED RINGERS IV SOLN: INTRAVENOUS | Status: DC

## 2021-06-02 MED ORDER — PROPOFOL 10 MG/ML IV BOLUS
INTRAVENOUS | Status: DC | PRN
  Administered 2021-06-02: 70 mg via INTRAVENOUS
  Administered 2021-06-02: 20 mg via INTRAVENOUS
  Administered 2021-06-02 (×2): 30 mg via INTRAVENOUS

## 2021-06-02 MED ORDER — STERILE WATER FOR IRRIGATION IR SOLN: Status: DC | PRN

## 2021-06-02 SURGICAL SUPPLY — 7 items
FORCEPS BIOP RAD 4 LRG CAP 4 (CUTTING FORCEPS) ×2 IMPLANT
GOWN CVR UNV OPN BCK APRN NK (MISCELLANEOUS) ×2 IMPLANT
GOWN ISOL THUMB LOOP REG UNIV (MISCELLANEOUS) ×4
KIT PRC NS LF DISP ENDO (KITS) ×1 IMPLANT
KIT PROCEDURE OLYMPUS (KITS) ×2
MANIFOLD NEPTUNE II (INSTRUMENTS) ×2 IMPLANT
WATER STERILE IRR 250ML POUR (IV SOLUTION) ×2 IMPLANT

## 2021-06-02 NOTE — Op Note (Signed)
[facility] Patient Name: Benjamin Boone Procedure Date: 06/02/2021 8:33 AM MRN: [id number] Account #: [id number] Date of Birth: November 23, 1956 Admit Type: Outpatient Age: 65 Room: [facility] OR ROOM 01 Gender: Male Note Status: Finalized Procedure:             Colonoscopy Indications:           Screening for colorectal malignant neoplasm Providers:             Midge Minium MD, MD Referring MD:          Benjamin Boone (Referring MD) Medicines:             Propofol per Anesthesia Complications:         No immediate complications. Procedure:             Pre-Anesthesia Assessment:                        - Prior to the procedure, a History and Physical was                         performed, and patient medications and allergies were                         reviewed. The patient's tolerance of previous                         anesthesia was also reviewed. The risks and benefits                         of the procedure and the sedation options and risks                         were discussed with the patient. All questions were                         answered, and informed consent was obtained. Prior                         Anticoagulants: The patient has taken no previous                         anticoagulant or antiplatelet agents. ASA Grade                         Assessment: II - A patient with mild systemic disease.                         After reviewing the risks and benefits, the patient                         was deemed in satisfactory condition to undergo the                         procedure.                        After obtaining informed consent, the colonoscope was  passed under direct vision. Throughout the procedure,                         the patient's blood pressure, pulse, and oxygen                         saturations were monitored continuously. The was                         introduced through the anus and  advanced to the the                         cecum, identified by appendiceal orifice and ileocecal                         valve. The colonoscopy was performed without                         difficulty. The patient tolerated the procedure well.                         The quality of the bowel preparation was excellent. Findings:      The perianal and digital rectal examinations were normal.      A 3 mm polyp was found in the rectum. The polyp was sessile. The polyp       was removed with a cold biopsy forceps. Resection and retrieval were       complete. Impression:            - One 3 mm polyp in the rectum, removed with a cold                         biopsy forceps. Resected and retrieved. Recommendation:        - Discharge patient to home.                        - Resume previous diet.                        - Continue present medications.                        - Await pathology results.                        - Repeat colonoscopy in 7 years for surveillance if                         adenomatous otherwise 10 years. Procedure Code(s):     --- Professional ---                        [phone], Colonoscopy, flexible; with biopsy, single or                         multiple Diagnosis Code(s):     --- Professional ---                        Z12.11, Encounter for screening for malignant neoplasm  of colon                        K62.1, Rectal polyp CPT copyright 2019 American Medical Association. All rights reserved. The codes documented in this report are preliminary and upon coder review may  be revised to meet current compliance requirements. Midge Minium MD, MD 06/02/2021 8:56:05 AM This report has been signed electronically. Number of Addenda: 0 Note Initiated On: 06/02/2021 8:33 AM Scope Withdrawal Time: 0 hours 9 minutes 30 seconds  Total Procedure Duration: 0 hours 12 minutes 23 seconds  Estimated Blood Loss:  Estimated blood loss: none.      Benjamin Boone

## 2021-06-02 NOTE — Anesthesia Postprocedure Evaluation (Signed)
Anesthesia Post Note  Patient: Benjamin Boone  Procedure(s) Performed: COLONOSCOPY WITH BIOPSY (Rectum) POLYPECTOMY (Rectum)     Patient location during evaluation: PACU Anesthesia Type: General Level of consciousness: awake and alert, oriented and patient cooperative Pain management: pain level controlled Vital Signs Assessment: post-procedure vital signs reviewed and stable Respiratory status: spontaneous breathing, nonlabored ventilation and respiratory function stable Cardiovascular status: blood pressure returned to baseline and stable Postop Assessment: adequate PO intake Anesthetic complications: no   No notable events documented.  Reed Breech

## 2021-06-02 NOTE — H&P (Signed)
Midge Minium, MD [facility] [address], [address] [place], [place] [address] Phone: [phone] Fax : [phone]  Primary Care Physician:  Larae Grooms, NP Primary Gastroenterologist:  Dr. Servando Snare  Pre-Procedure History & Physical: HPI:  Benjamin Boone is a 65 y.o. male is here for a screening colonoscopy.   Past Medical History:  Diagnosis Date   Allergy    Arthritis    Hyperlipidemia    Venous insufficiency     Past Surgical History:  Procedure Laterality Date   TONSILLECTOMY     TONSILLECTOMY      Prior to Admission medications   Medication Sig Start Date End Date Taking? Authorizing Provider  albuterol (VENTOLIN HFA) 108 (90 Base) MCG/ACT inhaler TAKE 2 PUFFS BY MOUTH EVERY 6 HOURS AS NEEDED FOR WHEEZE OR SHORTNESS OF BREATH 11/10/19  Yes Particia Nearing, PA-C  azelastine (ASTELIN) 0.1 % nasal spray Place 1 spray into both nostrils 2 (two) times daily. Use in each nostril as directed 04/03/20  Yes Particia Nearing, PA-C  fluticasone [facility]) 50 MCG/ACT nasal spray Place 2 sprays into both nostrils 2 (two) times daily. 04/03/20  Yes Particia Nearing, PA-C  gemfibrozil (LOPID) 600 MG tablet Take 1 tablet (600 mg total) by mouth 2 (two) times daily before a meal. 04/03/20  Yes Particia Nearing, PA-C  Multiple Vitamin (MULTIVITAMIN) tablet Take 1 tablet by mouth daily.   Yes [provider]    Allergies as of 05/20/2021 - Review Complete 12/16/2020  Allergen Reaction Noted   Doxycycline Rash 10/18/2015    Family History  Problem Relation Age of Onset   Heart disease Father        MI   Heart disease Mother        pacemaker   Heart attack Maternal Uncle    Prostate cancer Paternal Uncle     Social History   Socioeconomic History   Marital status: Married    Spouse name: Not on file   Number of children: Not on file   Years of education: Not on file   Highest education level: Not on file  Occupational History   Not on  file  Tobacco Use   Smoking status: Never   Smokeless tobacco: Never  Vaping Use   Vaping Use: Never used  Substance and Sexual Activity   Alcohol use: No   Drug use: No   Sexual activity: Yes  Other Topics Concern   Not on file  Social History Narrative   Not on file   Social Determinants of Health   Financial Resource Strain: Not on file  Food Insecurity: Not on file  Transportation Needs: Not on file  Physical Activity: Not on file  Stress: Not on file  Social Connections: Not on file  Intimate Partner Violence: Not on file    Review of Systems: See HPI, otherwise negative ROS  Physical Exam: BP 122/84   Pulse 71   Temp 97.8 F (36.6 C) (Temporal)   Wt 71.2 kg   SpO2 95%   BMI 25.34 kg/m  General:   Alert,  pleasant and cooperative in NAD Head:  Normocephalic and atraumatic. Neck:  Supple; no masses or thyromegaly. Lungs:  Clear throughout to auscultation.    Heart:  Regular rate and rhythm. Abdomen:  Soft, nontender and nondistended. Normal bowel sounds, without guarding, and without rebound.   Neurologic:  Alert and  oriented x4;  grossly normal neurologically.  Impression/Plan: Benjamin Boone is now here to undergo a  screening colonoscopy.  Risks, benefits, and alternatives regarding colonoscopy have been reviewed with the patient.  Questions have been answered.  All parties agreeable.

## 2021-06-02 NOTE — Transfer of Care (Signed)
Immediate Anesthesia Transfer of Care Note  Patient: Benjamin Boone  Procedure(s) Performed: COLONOSCOPY WITH PROPOFOL (Rectum)  Patient Location: PACU  Anesthesia Type: General  Level of Consciousness: awake, alert  and patient cooperative  Airway and Oxygen Therapy: Patient Spontanous Breathing and Patient connected to supplemental oxygen  Post-op Assessment: Post-op Vital signs reviewed, Patient's Cardiovascular Status Stable, Respiratory Function Stable, Patent Airway and No signs of Nausea or vomiting  Post-op Vital Signs: Reviewed and stable  Complications: No notable events documented.

## 2021-06-03 LAB — SURGICAL PATHOLOGY

## 2021-06-04 ENCOUNTER — Encounter: Payer: Self-pay | Admitting: Gastroenterology

## 2021-07-07 ENCOUNTER — Other Ambulatory Visit: Payer: Self-pay | Admitting: Family Medicine

## 2021-07-08 ENCOUNTER — Other Ambulatory Visit: Payer: Self-pay | Admitting: Family Medicine

## 2021-07-09 NOTE — Telephone Encounter (Signed)
Requested medication (s) are due for refill today: no  Requested medication (s) are on the active medication list: yes   Future visit scheduled: no  Notes to clinic: script requested is expired    Failed protocol: LDL in normal range and within 360 days   HDL in normal range and within 360 days   Triglycerides in normal range and within 360 days   ALT in normal range and within 180 days   AST in normal range and within 180 days   Cr in normal range and within 180 days   eGFR in normal range and within 180 days     Requested Prescriptions  Pending Prescriptions Disp Refills   gemfibrozil (LOPID) 600 MG tablet [Pharmacy Med Name: GEMFIBROZIL 600 MG TABLET] 180 tablet 1    Sig: TAKE 1 TABLET (600 MG TOTAL) BY MOUTH 2 (TWO) TIMES DAILY BEFORE A MEAL.     Cardiovascular:  Antilipid - Fibric Acid Derivatives Failed - 07/08/2021  4:11 PM      Failed - Total Cholesterol in normal range and within 360 days    Cholesterol, Total  Date Value Ref Range Status  10/07/2020 243 (H) 100 - 199 mg/dL Final          Failed - LDL in normal range and within 360 days    LDL Chol Calc (NIH)  Date Value Ref Range Status  10/07/2020 160 (H) 0 - 99 mg/dL Final          Failed - HDL in normal range and within 360 days    HDL  Date Value Ref Range Status  10/07/2020 36 (L) >39 mg/dL Final          Failed - Triglycerides in normal range and within 360 days    Triglycerides  Date Value Ref Range Status  10/07/2020 253 (H) 0 - 149 mg/dL Final          Failed - ALT in normal range and within 180 days    ALT  Date Value Ref Range Status  10/07/2020 13 0 - 44 IU/L Final          Failed - AST in normal range and within 180 days    AST  Date Value Ref Range Status  10/07/2020 18 0 - 40 IU/L Final          Failed - Cr in normal range and within 180 days    Creatinine, Ser  Date Value Ref Range Status  10/07/2020 0.92 0.76 - 1.27 mg/dL Final          Failed - eGFR in normal range  and within 180 days    GFR calc Af Amer  Date Value Ref Range Status  10/07/2020 103 >59 mL/min/1.73 Final    Comment:    **In accordance with recommendations from the NKF-ASN Task force,**   [facility] is in the process of updating its eGFR calculation to the   2021 CKD-EPI creatinine equation that estimates kidney function   without a race variable.    GFR calc non Af Amer  Date Value Ref Range Status  10/07/2020 89 >59 mL/min/1.73 Final          Passed - Valid encounter within last 12 months    Recent Outpatient Visits           6 months ago [place], DO   9 months ago Annual physical exam   [facility] [place]  A, NP   11 months ago Recurrent sinusitis   [facility] [place], [place]   1 year ago Mixed hyperlipidemia   [facility] [place] Roof [place], [place]   1 year ago Effusion of left olecranon bursa   [facility] [place] Roof [place], [place]

## 2021-07-11 ENCOUNTER — Other Ambulatory Visit: Payer: Self-pay | Admitting: Family Medicine

## 2021-07-11 NOTE — Telephone Encounter (Signed)
   Notes to clinic:  Looks like this medication was order under  Webb Silversmith who doesn't see patient. Jon Billings is provider for patient and needs to send script if appropriate .   Requested Prescriptions  Pending Prescriptions Disp Refills   gemfibrozil (LOPID) 600 MG tablet [Pharmacy Med Name: GEMFIBROZIL 600 MG TABLET] 180 tablet 1    Sig: TAKE 1 TABLET (600 MG TOTAL) BY MOUTH 2 (TWO) TIMES DAILY BEFORE A MEAL.     Cardiovascular:  Antilipid - Fibric Acid Derivatives Failed - 07/11/2021 10:16 AM      Failed - Total Cholesterol in normal range and within 360 days    Cholesterol, Total  Date Value Ref Range Status  10/07/2020 243 (H) 100 - 199 mg/dL Final          Failed - LDL in normal range and within 360 days    LDL Chol Calc (NIH)  Date Value Ref Range Status  10/07/2020 160 (H) 0 - 99 mg/dL Final          Failed - HDL in normal range and within 360 days    HDL  Date Value Ref Range Status  10/07/2020 36 (L) >39 mg/dL Final          Failed - Triglycerides in normal range and within 360 days    Triglycerides  Date Value Ref Range Status  10/07/2020 253 (H) 0 - 149 mg/dL Final          Failed - ALT in normal range and within 180 days    ALT  Date Value Ref Range Status  10/07/2020 13 0 - 44 IU/L Final          Failed - AST in normal range and within 180 days    AST  Date Value Ref Range Status  10/07/2020 18 0 - 40 IU/L Final          Failed - Cr in normal range and within 180 days    Creatinine, Ser  Date Value Ref Range Status  10/07/2020 0.92 0.76 - 1.27 mg/dL Final          Failed - eGFR in normal range and within 180 days    GFR calc Af Amer  Date Value Ref Range Status  10/07/2020 103 >59 mL/min/1.73 Final    Comment:    **In accordance with recommendations from the NKF-ASN Task force,**   [facility] is in the process of updating its eGFR calculation to the   2021 CKD-EPI creatinine equation that estimates kidney function   without a  race variable.    GFR calc non Af Amer  Date Value Ref Range Status  10/07/2020 89 >59 mL/min/1.73 Final          Passed - Valid encounter within last 12 months    Recent Outpatient Visits           6 months ago [place], DO   9 months ago Annual physical exam   [facility] Eulogio Bear, NP   12 months ago Recurrent sinusitis   [facility] Volney American, [place]   1 year ago Mixed hyperlipidemia   [facility] Merrie Roof [place], [place]   1 year ago Effusion of left olecranon bursa   [facility] Merrie Roof [place], [place]

## 2021-07-16 ENCOUNTER — Telehealth: Payer: Self-pay | Admitting: *Deleted

## 2021-07-16 NOTE — Telephone Encounter (Signed)
Patient has called in to report that his Rx is not at pharmacy- call to pharmacy- verbal refill given as sent in on 07/07/21.  Patient scheduled physical -10/08/21

## 2021-08-05 NOTE — Progress Notes (Signed)
There were no vitals taken for this visit.   Subjective:    Patient ID: Benjamin Boone, male    DOB: 07/23/1956, 65 y.o.   MRN: [id number]  HPI: Benjamin Boone is a 65 y.o. male  Chief Complaint  Patient presents with   URI    Pt states he has had been having congestion, sinus drainage, hoarseness, scratchy throat, and a little cough. States symptoms started about a week in a half ago.    UPPER RESPIRATORY TRACT INFECTION Worst symptom: congestion started when he mowed the yard about a week ago Fever: no Cough: yes Shortness of breath: no Wheezing: no Chest pain: no Chest tightness: no Chest congestion: no Nasal congestion: yes Runny nose: yes Post nasal drip: yes Sneezing: no Sore throat: yes Swollen glands: no Sinus pressure: yes Headache: yes Face pain: no Toothache: no Ear pain: no bilateral Ear pressure: no bilateral Eyes red/itching:no Eye drainage/crusting: no  Vomiting: no Rash: no Fatigue: no Sick contacts: no Strep contacts: no  Context: stable Recurrent sinusitis: no Relief with OTC cold/cough medications: yes  Treatments attempted: mucinex    Relevant past medical, surgical, family and social history reviewed and updated as indicated. Interim medical history since our last visit reviewed. Allergies and medications reviewed and updated.  Review of Systems  Per HPI unless specifically indicated above     Objective:    There were no vitals taken for this visit.  Wt Readings from Last 3 Encounters:  06/02/21 157 lb (71.2 kg)  10/07/20 159 lb 3.2 oz (72.2 kg)  07/15/20 157 lb (71.2 kg)    Physical Exam  Results for orders placed or performed during the hospital encounter of 06/02/21  Surgical pathology  Result Value Ref Range   SURGICAL PATHOLOGY      SURGICAL PATHOLOGY CASE: [id number] PATIENT: [facility] Surgical Pathology Report     Specimen Submitted: A. Rectum polyp x1,cbx  Clinical History:  Screening Z12.11      DIAGNOSIS: A. RECTUM POLYP; BIOPSY: - HYPERPLASTIC POLYP. - NEGATIVE FOR DYSPLASIA AND MALIGNANCY.   GROSS DESCRIPTION: A. Labeled: Rectal polyp (per requisition x1, cold biopsy) Received: Formalin Collection time: 8:49 AM on 06/02/2021 Placed into formalin time: 8:49 AM on 06/02/2021 Tissue fragment(s): 2 Size: Each 0.3 cm Description: Tan soft tissue fragments Entirely submitted in 1 cassette.  RB 06/02/2021  Final Diagnosis performed by Redmond Pulling, MD.   Electronically signed 06/03/2021 12:16:40PM The electronic signature indicates that the named Attending Pathologist has evaluated the specimen Technical component performed at [facility], [address] [place], [place] [address] Lab: [phone] Dir: [name] Schimke, MD, MMM  Professional component performed at [facility], [facility], [address] [place], [place], [place] [address] Lab: [phone] Dir: Georgiann Cocker. Oneita Kras, MD       Assessment & Plan:   Problem List Items Addressed This Visit   None   Follow up plan: No follow-ups on file.  This visit was completed via MyChart due to the restrictions of the COVID-19 pandemic. All issues as above were discussed and addressed. Physical exam was done as above through visual confirmation on MyChart. If it was felt that the patient should be evaluated in the office, they were directed there. The patient verbally consented to this visit. Location of the patient: Home Location of the provider: Office Those involved with this call:  Provider: Larae Grooms, NP CMA: Wilhemena Durie, CMA Front Desk/Registration: Wilhemena Durie, CMA This encounter was conducted via phone.  I spent 15  dedicated to the care of this patient on the date of this encounter to include previsit review of 21, face to face time with the patient, and post visit ordering of testing.

## 2021-08-06 ENCOUNTER — Telehealth (INDEPENDENT_AMBULATORY_CARE_PROVIDER_SITE_OTHER): Payer: 59 | Admitting: Nurse Practitioner

## 2021-08-06 ENCOUNTER — Encounter: Payer: Self-pay | Admitting: Nurse Practitioner

## 2021-08-06 ENCOUNTER — Telehealth: Payer: 59 | Admitting: Nurse Practitioner

## 2021-08-06 DIAGNOSIS — J0110 Acute frontal sinusitis, unspecified: Secondary | ICD-10-CM

## 2021-08-06 MED ORDER — AMOXICILLIN-POT CLAVULANATE 875-125 MG PO TABS: 1.0000 | ORAL_TABLET | Freq: Two times a day (BID) | ORAL | 0 refills | Status: AC

## 2021-09-22 ENCOUNTER — Ambulatory Visit: Payer: Self-pay

## 2021-09-22 NOTE — Telephone Encounter (Signed)
Post nasal drip- exhale congestion in throat. Had VV and prescribed abx. 1 month ago. Sx waxes and wanes.  Feels better during the day. Feel very congested. Nose blocked at night.  Phlegm greening foamy white when coughing hard.  When he blows his nose he can feel fluid behind (right ear) Mucinex with minimal effects.  Denies fever. No SOB. No chest pain or congestion.  Pt stated that it feels like the back of his throat I phlegm form his nose and it is difficulty to keep on top of it. Sometimes it is to the point where he is unable to talk without clearing his throat.  Pt stated it usually takes 2 rounds of abx to alleviate sx.  Use saline spray prn up 3 times away. Mild sore throat and scratchy.   Care advice given and pt verbalized understanding. VV scheduled for this Wednesday.   Reason for Disposition  [1] Sinus congestion (pressure, fullness) AND [2] present > 10 days  Answer Assessment - Initial Assessment Questions 1. LOCATION: "Where does it hurt?"      Have had headache 2. ONSET: "When did the sinus pain start?"  (e.g., hours, days)      Not really sinus pain 3. SEVERITY: "How bad is the pain?"   (Scale 1-10; mild, moderate or severe)   - MILD (1-3): doesn't interfere with normal activities    - MODERATE (4-7): interferes with normal activities (e.g., work or school) or awakens from sleep   - SEVERE (8-10): excruciating pain and patient unable to do any normal activities        N/a 4. RECURRENT SYMPTOM: "Have you ever had sinus problems before?" If Yes, ask: "When was the last time?" and "What happened that time?"      Yes- 1-2 times a year- abx 5. NASAL CONGESTION: "Is the nose blocked?" If Yes, ask: "Can you open it or must you breathe through your mouth?"     Blocked at night- open at day  6. NASAL DISCHARGE: "Do you have discharge from your nose?" If so ask, "What color?"     Yellow green and foamy 7. FEVER: "Do you have a fever?" If Yes, ask: "What is it, how was it  measured, and when did it start?"      no 8. OTHER SYMPTOMS: "Do you have any other symptoms?" (e.g., sore throat, cough, earache, difficulty breathing)     Right  ear pressure, scratchy throat,  9. PREGNANCY: "Is there any chance you are pregnant?" "When was your last menstrual period?"     N/a  Protocols used: Sinus Pain or Congestion-A-AH

## 2021-09-24 ENCOUNTER — Telehealth (INDEPENDENT_AMBULATORY_CARE_PROVIDER_SITE_OTHER): Payer: 59 | Admitting: Internal Medicine

## 2021-09-24 ENCOUNTER — Encounter: Payer: Self-pay | Admitting: Internal Medicine

## 2021-09-24 DIAGNOSIS — J0191 Acute recurrent sinusitis, unspecified: Secondary | ICD-10-CM

## 2021-09-24 MED ORDER — AZITHROMYCIN 250 MG PO TABS: ORAL_TABLET | ORAL | 0 refills | Status: AC

## 2021-09-24 MED ORDER — FEXOFENADINE HCL 180 MG PO TABS: 180.0000 mg | ORAL_TABLET | Freq: Every day | ORAL | 1 refills | Status: DC

## 2021-09-24 MED ORDER — FLUTICASONE PROPIONATE 50 MCG/ACT NA SUSP: 2.0000 | Freq: Two times a day (BID) | NASAL | 0 refills | Status: DC

## 2021-09-24 NOTE — Progress Notes (Signed)
There were no vitals taken for this visit.   Subjective:    Patient ID: Benjamin Boone, male    DOB: 09/17/1956, 65 y.o.   MRN: [id number]  Chief Complaint  Patient presents with   Nasal Congestion   sinus pressure   Cough   Ear pressure    HPI: Benjamin Boone is a 65 y.o. male   This visit was completed via video visit through MyChart due to the restrictions of the COVID-19 pandemic. All issues as above were discussed and addressed. Physical exam was done as above through visual confirmation on video through MyChart. If it was felt that the patient should be evaluated in the office, they were directed there. The patient verbally consented to this visit. Location of the patient: home Location of the provider: work Those involved with this call:  Provider: Loura Pardon, MD CMA: Tristan Schroeder, CMA Front Desk/Registration: Ozella Almond  Time spent on call: 10 minutes with patient face to face via video conference. More than 50% of this time was spent in counseling and coordination of care. 10 minutes total spent in review of patient's record and preparation of their chart.    Cough This is a new (has had chronic sinusitis , has had coughing x 2 weeks now green / clear - last time it was green, has had headaches.) problem. The current episode started more than 1 month ago. Associated symptoms include ear congestion, nasal congestion and postnasal drip. Pertinent negatives include no ear pain, fever, headaches, hemoptysis, rash, rhinorrhea, sore throat, shortness of breath or weight loss.  URI  This is a new problem. Associated symptoms include coughing. Pertinent negatives include no ear pain, headaches, rash, rhinorrhea or sore throat.   Chief Complaint  Patient presents with   Nasal Congestion   sinus pressure   Cough   Ear pressure    Relevant past medical, surgical, family and social history reviewed and updated as indicated. Interim medical history since  our last visit reviewed. Allergies and medications reviewed and updated.  Review of Systems  Constitutional:  Negative for fever and weight loss.  HENT:  Positive for postnasal drip. Negative for ear pain, rhinorrhea and sore throat.   Respiratory:  Positive for cough. Negative for hemoptysis and shortness of breath.   Skin:  Negative for rash.  Neurological:  Negative for headaches.   Per HPI unless specifically indicated above     Objective:    There were no vitals taken for this visit.  Wt Readings from Last 3 Encounters:  06/02/21 157 lb (71.2 kg)  10/07/20 159 lb 3.2 oz (72.2 kg)  07/15/20 157 lb (71.2 kg)    Physical Exam  Unable to peform sec to virtual visit.   Results for orders placed or performed during the hospital encounter of 06/02/21  Surgical pathology  Result Value Ref Range   SURGICAL PATHOLOGY      SURGICAL PATHOLOGY CASE: [id number] PATIENT: [facility] Surgical Pathology Report     Specimen Submitted: A. Rectum polyp x1,cbx  Clinical History: Screening Z12.11      DIAGNOSIS: A. RECTUM POLYP; BIOPSY: - HYPERPLASTIC POLYP. - NEGATIVE FOR DYSPLASIA AND MALIGNANCY.   GROSS DESCRIPTION: A. Labeled: Rectal polyp (per requisition x1, cold biopsy) Received: Formalin Collection time: 8:49 AM on 06/02/2021 Placed into formalin time: 8:49 AM on 06/02/2021 Tissue fragment(s): 2 Size: Each 0.3 cm Description: Tan soft tissue fragments Entirely submitted in 1 cassette.  RB 06/02/2021  Final Diagnosis performed by Redmond Pulling,  MD.   Electronically signed 06/03/2021 12:16:40PM The electronic signature indicates that the named Attending Pathologist has evaluated the specimen Technical component performed at [facility], [address], [place], [place] [address] Lab: [phone] Dir: [name] Schimke, MD, MMM  Professional component performed at [facility], [facility], [address] [place], [place], [place] [address] Lab:  [phone] Dir: Georgiann Cocker. Rub[place], MD         Current Outpatient Medications:    albuterol (VENTOLIN HFA) 108 (90 Base) MCG/ACT inhaler, TAKE 2 PUFFS BY MOUTH EVERY 6 HOURS AS NEEDED FOR WHEEZE OR SHORTNESS OF BREATH, Disp: 18 g, Rfl: 2   azelastine (ASTELIN) 0.1 % nasal spray, Place 1 spray into both nostrils 2 (two) times daily. Use in each nostril as directed, Disp: 30 mL, Rfl: 2   azithromycin (ZITHROMAX) 250 MG tablet, Take 2 tablets on day 1, then 1 tablet daily on days 2 through 5, Disp: 6 tablet, Rfl: 0   fexofenadine (ALLEGRA ALLERGY) 180 MG tablet, Take 1 tablet (180 mg total) by mouth daily., Disp: 10 tablet, Rfl: 1   gemfibrozil (LOPID) 600 MG tablet, TAKE 1 TABLET (600 MG TOTAL) BY MOUTH 2 (TWO) TIMES DAILY BEFORE A MEAL., Disp: 180 tablet, Rfl: 0   Multiple Vitamin (MULTIVITAMIN) tablet, Take 1 tablet by mouth daily., Disp: , Rfl:    fluticasone (FLONASE) 50 MCG/ACT nasal spray, Place 2 sprays into both nostrils 2 (two) times daily., Disp: 16 mL, Rfl: 0    Assessment & Plan:  Acute sinsitis :  Will start pt on zpak Continue flonase, allegra q pm  Use albuterol q 4-6 hrly  pt advised to take Tylenol q 4- 6 hourly as needed. pt to take allegra q pm as needed and to call office if symptoms worsened pt verbalised understanding of such.    Problem List Items Addressed This Visit   None       Meds ordered this encounter  Medications   azithromycin (ZITHROMAX) 250 MG tablet    Sig: Take 2 tablets on day 1, then 1 tablet daily on days 2 through 5    Dispense:  6 tablet    Refill:  0   fluticasone (FLONASE) 50 MCG/ACT nasal spray    Sig: Place 2 sprays into both nostrils 2 (two) times daily.    Dispense:  16 mL    Refill:  0   fexofenadine (ALLEGRA ALLERGY) 180 MG tablet    Sig: Take 1 tablet (180 mg total) by mouth daily.    Dispense:  10 tablet    Refill:  1     Follow up plan: No follow-ups on file.

## 2021-09-25 ENCOUNTER — Encounter: Payer: Self-pay | Admitting: Internal Medicine

## 2021-10-01 ENCOUNTER — Other Ambulatory Visit: Payer: Self-pay | Admitting: Internal Medicine

## 2021-10-02 NOTE — Telephone Encounter (Signed)
Requested by interface surescripts. Already signed and refilled on 09/24/21. Requested Prescriptions  Refused Prescriptions Disp Refills  . fluticasone (FLONASE) 50 MCG/ACT nasal spray [Pharmacy Med Name: FLUTICASONE PROP 50 MCG SPRAY] 16 mL 0    Sig: PLACE 2 SPRAYS INTO BOTH NOSTRILS 2 (TWO) TIMES DAILY     Ear, Nose, and Throat: Nasal Preparations - Corticosteroids Passed - 10/01/2021  9:30 AM      Passed - Valid encounter within last 12 months    Recent Outpatient Visits          1 week ago Acute recurrent sinusitis, unspecified location   [facility] Vigg, Avanti, MD   1 month ago Acute non-recurrent frontal sinusitis   [facility] Larae Grooms, NP   9 months ago COVID   [facility], [place], DO   12 months ago Annual physical exam   [facility] Valentino Nose, NP   1 year ago Recurrent sinusitis   [facility], Salley Hews, [place]      Future Appointments            In 6 days Larae Grooms, NP [facility]

## 2021-10-07 ENCOUNTER — Other Ambulatory Visit: Payer: Self-pay | Admitting: Nurse Practitioner

## 2021-10-07 NOTE — Telephone Encounter (Signed)
30 day supply sent. Will need an appt for further refills.

## 2021-10-07 NOTE — Telephone Encounter (Signed)
Requested medication (s) are due for refill today: yes  Requested medication (s) are on the active medication list: yes  Last refill:  07/07/21 #180 0 refills  Future visit scheduled: yes tomorrow  Notes to clinic:  no labs since 10/07/20. Do you want to refill Rx?     Requested Prescriptions  Pending Prescriptions Disp Refills   gemfibrozil (LOPID) 600 MG tablet [Pharmacy Med Name: GEMFIBROZIL 600 MG TABLET] 180 tablet 0    Sig: TAKE 1 TABLET BY MOUTH TWICE DAILY BEFORE A MEAL     Cardiovascular:  Antilipid - Fibric Acid Derivatives Failed - 10/07/2021  8:31 AM      Failed - Total Cholesterol in normal range and within 360 days    Cholesterol, Total  Date Value Ref Range Status  10/07/2020 243 (H) 100 - 199 mg/dL Final          Failed - LDL in normal range and within 360 days    LDL Chol Calc (NIH)  Date Value Ref Range Status  10/07/2020 160 (H) 0 - 99 mg/dL Final          Failed - HDL in normal range and within 360 days    HDL  Date Value Ref Range Status  10/07/2020 36 (L) >39 mg/dL Final          Failed - Triglycerides in normal range and within 360 days    Triglycerides  Date Value Ref Range Status  10/07/2020 253 (H) 0 - 149 mg/dL Final          Failed - ALT in normal range and within 180 days    ALT  Date Value Ref Range Status  10/07/2020 13 0 - 44 IU/L Final          Failed - AST in normal range and within 180 days    AST  Date Value Ref Range Status  10/07/2020 18 0 - 40 IU/L Final          Failed - Cr in normal range and within 180 days    Creatinine, Ser  Date Value Ref Range Status  10/07/2020 0.92 0.76 - 1.27 mg/dL Final          Failed - eGFR in normal range and within 180 days    GFR calc Af Amer  Date Value Ref Range Status  10/07/2020 103 >59 mL/min/1.73 Final    Comment:    **In accordance with recommendations from the NKF-ASN Task force,**   [facility] is in the process of updating its eGFR calculation to the   2021 CKD-EPI  creatinine equation that estimates kidney function   without a race variable.    GFR calc non Af Amer  Date Value Ref Range Status  10/07/2020 89 >59 mL/min/1.73 Final          Passed - Valid encounter within last 12 months    Recent Outpatient Visits           1 week ago Acute recurrent sinusitis, unspecified location   [facility] Vigg, Avanti, MD   2 months ago Acute non-recurrent frontal sinusitis   [facility] Larae Grooms, NP   9 months ago COVID   [facility], [place], DO   1 year ago Annual physical exam   [facility] Valentino Nose, NP   1 year ago Recurrent sinusitis   [facility] Particia Nearing, [place]       Future Appointments  Tomorrow Jon Billings, NP [facility], [facility]

## 2021-10-07 NOTE — Telephone Encounter (Signed)
Appointment scheduled 11/16; pt will discuss medications then.

## 2021-10-08 ENCOUNTER — Ambulatory Visit (INDEPENDENT_AMBULATORY_CARE_PROVIDER_SITE_OTHER): Payer: 59 | Admitting: Nurse Practitioner

## 2021-10-08 ENCOUNTER — Encounter: Payer: Self-pay | Admitting: Nurse Practitioner

## 2021-10-08 ENCOUNTER — Other Ambulatory Visit: Payer: Self-pay

## 2021-10-08 VITALS — BP 117/75 | HR 78 | Temp 97.9°F | Ht 64.5 in | Wt 162.6 lb

## 2021-10-08 DIAGNOSIS — Z23 Encounter for immunization: Secondary | ICD-10-CM

## 2021-10-08 DIAGNOSIS — L4050 Arthropathic psoriasis, unspecified: Secondary | ICD-10-CM

## 2021-10-08 DIAGNOSIS — E782 Mixed hyperlipidemia: Secondary | ICD-10-CM

## 2021-10-08 DIAGNOSIS — Z0000 Encounter for general adult medical examination without abnormal findings: Secondary | ICD-10-CM | POA: Diagnosis not present

## 2021-10-08 LAB — URINALYSIS, ROUTINE W REFLEX MICROSCOPIC
Bilirubin, UA: NEGATIVE
Glucose, UA: NEGATIVE
Ketones, UA: NEGATIVE
Leukocytes,UA: NEGATIVE
Nitrite, UA: NEGATIVE
Protein,UA: NEGATIVE
RBC, UA: NEGATIVE
Specific Gravity, UA: >1.03 — ABNORMAL HIGH (ref 1.005–1.030)
Urobilinogen, Ur: 0.2 mg/dL (ref 0.2–1.0)
pH, UA: 5 (ref 5.0–7.5)

## 2021-10-08 NOTE — Assessment & Plan Note (Signed)
Chronic.  Controlled.  Continue with current medication regimen on Gemfibrizol 600mg  BID.  Labs ordered today.  Return to clinic in 6 months for reevaluation.  Call sooner if concerns arise.

## 2021-10-08 NOTE — Assessment & Plan Note (Signed)
Chronic.  Controlled.  Continue with current medication regimen.  Labs ordered today.  Return to clinic in 6 months for reevaluation.  Call sooner if concerns arise.  ? ?

## 2021-10-08 NOTE — Progress Notes (Signed)
BP 117/75   Pulse 78   Temp 97.9 F (36.6 C) (Oral)   Ht 5' 4.5" (1.638 m)   Wt 162 lb 9.6 oz (73.8 kg)   SpO2 98%   BMI 27.48 kg/m    Subjective:    Patient ID: Benjamin Boone, male    DOB: 1955/12/23, 65 y.o.   MRN: [id number]  HPI: Benjamin Boone is a 65 y.o. male presenting on 10/08/2021 for comprehensive medical examination. Current medical complaints include:none  He currently lives with: Interim Problems from his last visit: no  PSORIATIC ARTHRITIS Patient does not see any specialists for this.  No current issues.   HYPERLIPIDEMIA Hyperlipidemia status: excellent compliance Satisfied with current treatment?  yes Side effects:  no Medication compliance: excellent compliance Past cholesterol meds: gemfibrozil Supplements: none Aspirin:  no The 10-year ASCVD risk score (Arnett DK, et al., 2019) is: 13.4%   Values used to calculate the score:     Age: 67 years     Sex: Male     Is Non-Hispanic African American: No     Diabetic: No     Tobacco smoker: No     Systolic Blood Pressure: 117 mmHg     Is BP treated: No     HDL Cholesterol: 36 mg/dL     Total Cholesterol: 243 mg/dL Chest pain:  no Coronary artery disease:  no Family history CAD:  no Family history early CAD:  no   Denies HA, CP, SOB, dizziness, palpitations, visual changes, and lower extremity swelling.   Depression Screen done today and results listed below:  Depression screen [facility] 2/9 10/08/2021 09/24/2021 10/07/2020 10/04/2019 12/21/2017  Decreased Interest 0 0 0 0 0  Down, Depressed, Hopeless 0 0 0 0 0  PHQ - 2 Score 0 0 0 0 0  Altered sleeping 0 0 - 1 0  Tired, decreased energy 0 0 - 0 0  Change in appetite 0 0 - 0 0  Feeling bad or failure about yourself  0 0 - 0 0  Trouble concentrating 0 0 - 0 0  Moving slowly or fidgety/restless 0 0 - 0 0  Suicidal thoughts 0 0 - 0 0  PHQ-9 Score 0 0 - 1 0  Difficult doing work/chores Not difficult at all Not difficult at all - - -     The patient does not have a history of falls. I did not complete a risk assessment for falls. A plan of care for falls was documented.   Past Medical History:  Past Medical History:  Diagnosis Date   Allergy    Arthritis    Hyperlipidemia    Venous insufficiency     Surgical History:  Past Surgical History:  Procedure Laterality Date   COLONOSCOPY WITH PROPOFOL N/A 06/02/2021   Procedure: COLONOSCOPY WITH BIOPSY;  Surgeon: Midge Minium, MD;  Location: [facility];  Service: Endoscopy;  Laterality: N/A;   POLYPECTOMY N/A 06/02/2021   Procedure: POLYPECTOMY;  Surgeon: Midge Minium, MD;  Location: [facility];  Service: Endoscopy;  Laterality: N/A;   TONSILLECTOMY     TONSILLECTOMY      Medications:  Current Outpatient Medications on File Prior to Visit  Medication Sig   albuterol (VENTOLIN HFA) 108 (90 Base) MCG/ACT inhaler TAKE 2 PUFFS BY MOUTH EVERY 6 HOURS AS NEEDED FOR WHEEZE OR SHORTNESS OF BREATH   azelastine (ASTELIN) 0.1 % nasal spray Place 1 spray into both nostrils 2 (two) times daily. Use in each nostril as  directed   fluticasone (FLONASE) 50 MCG/ACT nasal spray Place 2 sprays into both nostrils 2 (two) times daily.   gemfibrozil (LOPID) 600 MG tablet TAKE 1 TABLET BY MOUTH TWICE DAILY BEFORE A MEAL   Multiple Vitamin (MULTIVITAMIN) tablet Take 1 tablet by mouth daily.   fexofenadine (ALLEGRA ALLERGY) 180 MG tablet Take 1 tablet (180 mg total) by mouth daily. (Patient not taking: Reported on 10/08/2021)   No current facility-administered medications on file prior to visit.    Allergies:  Allergies  Allergen Reactions   Doxycycline Rash    Social History:  Social History   Socioeconomic History   Marital status: Married    Spouse name: Not on file   Number of children: Not on file   Years of education: Not on file   Highest education level: Not on file  Occupational History   Not on file  Tobacco Use   Smoking status: Never    Smokeless tobacco: Never  Vaping Use   Vaping Use: Never used  Substance and Sexual Activity   Alcohol use: No   Drug use: No   Sexual activity: Yes  Other Topics Concern   Not on file  Social History Narrative   Not on file   Social Determinants of Health   Financial Resource Strain: Not on file  Food Insecurity: Not on file  Transportation Needs: Not on file  Physical Activity: Not on file  Stress: Not on file  Social Connections: Not on file  Intimate Partner Violence: Not on file   Social History   Tobacco Use  Smoking Status Never  Smokeless Tobacco Never   Social History   Substance and Sexual Activity  Alcohol Use No    Family History:  Family History  Problem Relation Age of Onset   Heart disease Father        MI   Heart disease Mother        pacemaker   Heart attack Maternal Uncle    Prostate cancer Paternal Uncle     Past medical history, surgical history, medications, allergies, family history and social history reviewed with patient today and changes made to appropriate areas of the chart.   Review of Systems  Eyes:  Negative for blurred vision and double vision.  Respiratory:  Negative for shortness of breath.   Cardiovascular:  Negative for chest pain, palpitations and leg swelling.  Neurological:  Negative for dizziness and headaches.  All other ROS negative except what is listed above and in the HPI.      Objective:    BP 117/75   Pulse 78   Temp 97.9 F (36.6 C) (Oral)   Ht 5' 4.5" (1.638 m)   Wt 162 lb 9.6 oz (73.8 kg)   SpO2 98%   BMI 27.48 kg/m   Wt Readings from Last 3 Encounters:  10/08/21 162 lb 9.6 oz (73.8 kg)  06/02/21 157 lb (71.2 kg)  10/07/20 159 lb 3.2 oz (72.2 kg)    Physical Exam Vitals and nursing note reviewed.  Constitutional:      General: He is not in acute distress.    Appearance: Normal appearance. He is not ill-appearing, toxic-appearing or diaphoretic.  HENT:     Head: Normocephalic.     Right Ear:  Tympanic membrane, ear canal and external ear normal.     Left Ear: Tympanic membrane, ear canal and external ear normal.     Nose: Nose normal. No congestion or rhinorrhea.     Mouth/Throat:  Mouth: Mucous membranes are moist.  Eyes:     General:        Right eye: No discharge.        Left eye: No discharge.     Extraocular Movements: Extraocular movements intact.     Conjunctiva/sclera: Conjunctivae normal.     Pupils: Pupils are equal, round, and reactive to light.  Cardiovascular:     Rate and Rhythm: Normal rate and regular rhythm.     Heart sounds: No murmur heard. Pulmonary:     Effort: Pulmonary effort is normal. No respiratory distress.     Breath sounds: Normal breath sounds. No wheezing, rhonchi or rales.  Abdominal:     General: Abdomen is flat. Bowel sounds are normal. There is no distension.     Palpations: Abdomen is soft.     Tenderness: There is no abdominal tenderness. There is no guarding.  Musculoskeletal:     Cervical back: Normal range of motion and neck supple.  Skin:    General: Skin is warm and dry.     Capillary Refill: Capillary refill takes less than 2 seconds.  Neurological:     General: No focal deficit present.     Mental Status: He is alert and oriented to person, place, and time.     Cranial Nerves: No cranial nerve deficit.     Motor: No weakness.     Deep Tendon Reflexes: Reflexes normal.  Psychiatric:        Mood and Affect: Mood normal.        Behavior: Behavior normal.        Thought Content: Thought content normal.        Judgment: Judgment normal.    Results for orders placed or performed during the hospital encounter of 06/02/21  Surgical pathology  Result Value Ref Range   SURGICAL PATHOLOGY      SURGICAL PATHOLOGY CASE: [id number] PATIENT: [facility] Surgical Pathology Report     Specimen Submitted: A. Rectum polyp x1,cbx  Clinical History: Screening Z12.11      DIAGNOSIS: A. RECTUM POLYP; BIOPSY: -  HYPERPLASTIC POLYP. - NEGATIVE FOR DYSPLASIA AND MALIGNANCY.   GROSS DESCRIPTION: A. Labeled: Rectal polyp (per requisition x1, cold biopsy) Received: Formalin Collection time: 8:49 AM on 06/02/2021 Placed into formalin time: 8:49 AM on 06/02/2021 Tissue fragment(s): 2 Size: Each 0.3 cm Description: Tan soft tissue fragments Entirely submitted in 1 cassette.  RB 06/02/2021  Final Diagnosis performed by Redmond Pulling, MD.   Electronically signed 06/03/2021 12:16:40PM The electronic signature indicates that the named Attending Pathologist has evaluated the specimen Technical component performed at [facility], [address], [place], [place] [address] Lab: [phone] Dir: [name] Schimke, MD, MMM  Professional component performed at [facility], [address] [place], [place], [place] [address] Lab: [phone] Dir: Georgiann Cocker. Oneita Kras, MD       Assessment & Plan:   Problem List Items Addressed This Visit       Musculoskeletal and Integument   Psoriatic arthritis (HCC)    Chronic.  Controlled.  Continue with current medication regimen.  Labs ordered today.  Return to clinic in 6 months for reevaluation.  Call sooner if concerns arise.          Other   Hyperlipidemia    Chronic.  Controlled.  Continue with current medication regimen on Gemfibrizol 600mg  BID.  Labs ordered today.  Return to clinic in 6 months for reevaluation.  Call sooner if concerns arise.  Relevant Orders   Lipid panel   Other Visit Diagnoses     Annual physical exam    -  Primary   Health maintenance reviewed during visit today. Labs ordered. Flu shot given.    Relevant Orders   TSH   PSA   CBC with Differential/Platelet   Comprehensive metabolic panel   Urinalysis, Routine w reflex microscopic   Need for influenza vaccination       Relevant Orders   Flu Vaccine QUAD 56mo+IM (Fluarix, Fluzone & Alfiuria Quad PF) (Completed)        Discussed aspirin prophylaxis for  myocardial infarction prevention and decision was it was not indicated  LABORATORY TESTING:  Health maintenance labs ordered today as discussed above.   The natural history of prostate cancer and ongoing controversy regarding screening and potential treatment outcomes of prostate cancer has been discussed with the patient. The meaning of a false positive PSA and a false negative PSA has been discussed. He indicates understanding of the limitations of this screening test and wishes to proceed with screening PSA testing.   IMMUNIZATIONS:   - Tdap: Tetanus vaccination status reviewed: last tetanus booster within 10 years. - Influenza: Administered today - Pneumovax: Not applicable - Prevnar: Not applicable - COVID: Refused - HPV: Not applicable - Shingrix vaccine:  Never had chicken pox  SCREENING: - Colonoscopy: Up to date  Discussed with patient purpose of the colonoscopy is to detect colon cancer at curable precancerous or early stages   - AAA Screening: Not applicable  -Hearing Test: Not applicable  -Spirometry: Not applicable   PATIENT COUNSELING:    Sexuality: Discussed sexually transmitted diseases, partner selection, use of condoms, avoidance of unintended pregnancy  and contraceptive alternatives.   Advised to avoid cigarette smoking.  I discussed with the patient that most people either abstain from alcohol or drink within safe limits (<=14/week and <=4 drinks/occasion for males, <=7/weeks and <= 3 drinks/occasion for females) and that the risk for alcohol disorders and other health effects rises proportionally with the number of drinks per week and how often a drinker exceeds daily limits.  Discussed cessation/primary prevention of drug use and availability of treatment for abuse.   Diet: Encouraged to adjust caloric intake to maintain  or achieve ideal body weight, to reduce intake of dietary saturated fat and total fat, to limit sodium intake by avoiding high sodium foods  and not adding table salt, and to maintain adequate dietary potassium and calcium preferably from fresh fruits, vegetables, and low-fat dairy products.    stressed the importance of regular exercise  Injury prevention: Discussed safety belts, safety helmets, smoke detector, smoking near bedding or upholstery.   Dental health: Discussed importance of regular tooth brushing, flossing, and dental visits.   Follow up plan: NEXT PREVENTATIVE PHYSICAL DUE IN 1 YEAR. Return in about 1 year (around 10/08/2022) for Physical and Fasting labs.

## 2021-10-09 LAB — CBC WITH DIFFERENTIAL/PLATELET
Basophils Absolute: 0.1 10*3/uL (ref 0.0–0.2)
Basos: 2 %
EOS (ABSOLUTE): 0.7 10*3/uL — ABNORMAL HIGH (ref 0.0–0.4)
Eos: 11 %
Hematocrit: 45.2 % (ref 37.5–51.0)
Hemoglobin: 15.5 g/dL (ref 13.0–17.7)
Immature Grans (Abs): 0 10*3/uL (ref 0.0–0.1)
Immature Granulocytes: 0 %
Lymphocytes Absolute: 1.8 10*3/uL (ref 0.7–3.1)
Lymphs: 31 %
MCH: 29.1 pg (ref 26.6–33.0)
MCHC: 34.3 g/dL (ref 31.5–35.7)
MCV: 85 fL (ref 79–97)
Monocytes Absolute: 0.6 10*3/uL (ref 0.1–0.9)
Monocytes: 10 %
Neutrophils Absolute: 2.7 10*3/uL (ref 1.4–7.0)
Neutrophils: 46 %
Platelets: 336 10*3/uL (ref 150–450)
RBC: 5.32 x10E6/uL (ref 4.14–5.80)
RDW: 13.1 % (ref 11.6–15.4)
WBC: 5.9 10*3/uL (ref 3.4–10.8)

## 2021-10-09 LAB — COMPREHENSIVE METABOLIC PANEL
ALT: 21 IU/L (ref 0–44)
AST: 24 IU/L (ref 0–40)
Albumin/Globulin Ratio: 1.8 (ref 1.2–2.2)
Albumin: 4.6 g/dL (ref 3.8–4.8)
Alkaline Phosphatase: 64 IU/L (ref 44–121)
BUN/Creatinine Ratio: 15 (ref 10–24)
BUN: 18 mg/dL (ref 8–27)
Bilirubin Total: 0.3 mg/dL (ref 0.0–1.2)
CO2: 22 mmol/L (ref 20–29)
Calcium: 9.7 mg/dL (ref 8.6–10.2)
Chloride: 103 mmol/L (ref 96–106)
Creatinine, Ser: 1.17 mg/dL (ref 0.76–1.27)
Globulin, Total: 2.5 g/dL (ref 1.5–4.5)
Glucose: 86 mg/dL (ref 70–99)
Potassium: 4.2 mmol/L (ref 3.5–5.2)
Sodium: 141 mmol/L (ref 134–144)
Total Protein: 7.1 g/dL (ref 6.0–8.5)
eGFR: 70 mL/min/{1.73_m2} (ref >59)

## 2021-10-09 LAB — LIPID PANEL
Chol/HDL Ratio: 6.2 ratio — ABNORMAL HIGH (ref 0.0–5.0)
Cholesterol, Total: 218 mg/dL — ABNORMAL HIGH (ref 100–199)
HDL: 35 mg/dL — ABNORMAL LOW (ref >39)
LDL Chol Calc (NIH): 151 mg/dL — ABNORMAL HIGH (ref 0–99)
Triglycerides: 174 mg/dL — ABNORMAL HIGH (ref 0–149)
VLDL Cholesterol Cal: 32 mg/dL (ref 5–40)

## 2021-10-09 LAB — PSA: Prostate Specific Ag, Serum: 0.9 ng/mL (ref 0.0–4.0)

## 2021-10-09 LAB — TSH: TSH: 4.27 u[IU]/mL (ref 0.450–4.500)

## 2021-10-09 NOTE — Progress Notes (Signed)
Hi Benjamin Boone.  Your lab work shows that your cholesterol is still elevated.  I recommend you follow a low fat diet and continue with the Gemfibrozil.  Otherwise, your lab work looks good.  Please let me know if you have any questions.

## 2021-10-14 ENCOUNTER — Other Ambulatory Visit: Payer: Self-pay | Admitting: Internal Medicine

## 2021-10-14 NOTE — Telephone Encounter (Signed)
I called [facility] [phone] and spoke with Morrie Sheldon regarding the duplicate request for the Flonase nasal spray.  The last request from 09/24/2021 does not have any refills remaining.   They need a new Rx.    I sent the refill request to [facility] for a new Rx to be sent in.

## 2021-10-28 ENCOUNTER — Other Ambulatory Visit: Payer: Self-pay | Admitting: Nurse Practitioner

## 2021-11-02 ENCOUNTER — Other Ambulatory Visit: Payer: Self-pay | Admitting: Nurse Practitioner

## 2021-11-02 NOTE — Telephone Encounter (Signed)
Requested Prescriptions  Pending Prescriptions Disp Refills  . gemfibrozil (LOPID) 600 MG tablet [Pharmacy Med Name: GEMFIBROZIL 600 MG TABLET] 180 tablet 1    Sig: TAKE 1 TABLET BY MOUTH TWICE DAILY BEFORE A MEAL     Cardiovascular:  Antilipid - Fibric Acid Derivatives Failed - 11/02/2021  9:32 AM      Failed - Total Cholesterol in normal range and within 360 days    Cholesterol, Total  Date Value Ref Range Status  10/08/2021 218 (H) 100 - 199 mg/dL Final         Failed - LDL in normal range and within 360 days    LDL Chol Calc (NIH)  Date Value Ref Range Status  10/08/2021 151 (H) 0 - 99 mg/dL Final         Failed - HDL in normal range and within 360 days    HDL  Date Value Ref Range Status  10/08/2021 35 (L) >39 mg/dL Final         Failed - Triglycerides in normal range and within 360 days    Triglycerides  Date Value Ref Range Status  10/08/2021 174 (H) 0 - 149 mg/dL Final         Passed - ALT in normal range and within 180 days    ALT  Date Value Ref Range Status  10/08/2021 21 0 - 44 IU/L Final         Passed - AST in normal range and within 180 days    AST  Date Value Ref Range Status  10/08/2021 24 0 - 40 IU/L Final         Passed - Cr in normal range and within 180 days    Creatinine, Ser  Date Value Ref Range Status  10/08/2021 1.17 0.76 - 1.27 mg/dL Final         Passed - eGFR in normal range and within 180 days    GFR calc Af Amer  Date Value Ref Range Status  10/07/2020 103 >59 mL/min/1.73 Final    Comment:    **In accordance with recommendations from the NKF-ASN Task force,**   [facility] is in the process of updating its eGFR calculation to the   2021 CKD-EPI creatinine equation that estimates kidney function   without a race variable.    GFR calc non Af Amer  Date Value Ref Range Status  10/07/2020 89 >59 mL/min/1.73 Final   eGFR  Date Value Ref Range Status  10/08/2021 70 >59 mL/min/1.73 Final         Passed - Valid encounter within  last 12 months    Recent Outpatient Visits          3 weeks ago Annual physical exam   [facility] Holdsworth, Karen, NP   1 month ago Acute recurrent sinusitis, unspecified location   [facility] Vigg, Avanti, MD   2 months ago Acute non-recurrent frontal sinusitis   [facility] Holdsworth, Karen, NP   10 months ago COVID   [facility] Johnson, Megan P, DO   1 year ago Annual physical exam   [facility] Martinez, Jessica A, NP      Future Appointments            In 11 months Holdsworth, Karen, NP [facility]             

## 2021-11-25 ENCOUNTER — Other Ambulatory Visit: Payer: Self-pay | Admitting: Nurse Practitioner

## 2021-11-26 NOTE — Telephone Encounter (Signed)
Requested Prescriptions  Pending Prescriptions Disp Refills   fluticasone (FLONASE) 50 MCG/ACT nasal spray [Pharmacy Med Name: FLUTICASONE PROP 50 MCG SPRAY] 16 mL 0    Sig: PLACE 2 SPRAYS INTO BOTH NOSTRILS 2 (TWO) TIMES DAILY     Ear, Nose, and Throat: Nasal Preparations - Corticosteroids Passed - 11/25/2021  1:32 PM      Passed - Valid encounter within last 12 months    Recent Outpatient Visits          1 month ago Annual physical exam   [facility] Larae Grooms, NP   2 months ago Acute recurrent sinusitis, unspecified location   [facility] Vigg, Avanti, MD   3 months ago Acute non-recurrent frontal sinusitis   [facility] Larae Grooms, NP   11 months ago COVID   [facility], [place], DO   1 year ago Annual physical exam   [facility] Valentino Nose, NP      Future Appointments            In 10 months Larae Grooms, NP [facility]

## 2021-12-10 ENCOUNTER — Other Ambulatory Visit: Payer: Self-pay | Admitting: Nurse Practitioner

## 2022-06-28 ENCOUNTER — Other Ambulatory Visit: Payer: Self-pay | Admitting: Nurse Practitioner

## 2022-06-29 NOTE — Telephone Encounter (Signed)
Requested Prescriptions  Pending Prescriptions Disp Refills  . gemfibrozil (LOPID) 600 MG tablet [Pharmacy Med Name: GEMFIBROZIL 600 MG TABLET] 180 tablet 1    Sig: TAKE 1 TABLET BY MOUTH TWICE A DAY BEFORE A MEAL     Cardiovascular:  Antilipid - Fibric Acid Derivatives Failed - 06/28/2022  9:29 AM      Failed - Lipid Panel in normal range within the last 12 months    Cholesterol, Total  Date Value Ref Range Status  10/08/2021 218 (H) 100 - 199 mg/dL Final   LDL Chol Calc (NIH)  Date Value Ref Range Status  10/08/2021 151 (H) 0 - 99 mg/dL Final   HDL  Date Value Ref Range Status  10/08/2021 35 (L) >39 mg/dL Final   Triglycerides  Date Value Ref Range Status  10/08/2021 174 (H) 0 - 149 mg/dL Final         Passed - ALT in normal range and within 360 days    ALT  Date Value Ref Range Status  10/08/2021 21 0 - 44 IU/L Final         Passed - AST in normal range and within 360 days    AST  Date Value Ref Range Status  10/08/2021 24 0 - 40 IU/L Final         Passed - Cr in normal range and within 360 days    Creatinine, Ser  Date Value Ref Range Status  10/08/2021 1.17 0.76 - 1.27 mg/dL Final         Passed - HGB in normal range and within 360 days    Hemoglobin  Date Value Ref Range Status  10/08/2021 15.5 13.0 - 17.7 g/dL Final         Passed - HCT in normal range and within 360 days    Hematocrit  Date Value Ref Range Status  10/08/2021 45.2 37.5 - 51.0 % Final         Passed - PLT in normal range and within 360 days    Platelets  Date Value Ref Range Status  10/08/2021 336 150 - 450 x10E3/uL Final         Passed - WBC in normal range and within 360 days    WBC  Date Value Ref Range Status  10/08/2021 5.9 3.4 - 10.8 x10E3/uL Final         Passed - eGFR is 30 or above and within 360 days    GFR calc Af Amer  Date Value Ref Range Status  10/07/2020 103 >59 mL/min/1.73 Final    Comment:    **In accordance with recommendations from the NKF-ASN Task  force,**   [facility] is in the process of updating its eGFR calculation to the   2021 CKD-EPI creatinine equation that estimates kidney function   without a race variable.    GFR calc non Af Amer  Date Value Ref Range Status  10/07/2020 89 >59 mL/min/1.73 Final   eGFR  Date Value Ref Range Status  10/08/2021 70 >59 mL/min/1.73 Final         Passed - Valid encounter within last 12 months    Recent Outpatient Visits          8 months ago Annual physical exam   [place], NP   9 months ago Acute recurrent sinusitis, unspecified location   [place], MD   10 months ago Acute non-recurrent frontal sinusitis   [facility], Santiago Glad,  NP   1 year ago [place], DO   1 year ago Annual physical exam   [facility] Eulogio Bear, NP      Future Appointments            In 3 months Jon Billings, NP [facility] [place]

## 2022-09-22 ENCOUNTER — Encounter: Payer: Self-pay | Admitting: Unknown Physician Specialty

## 2022-09-22 ENCOUNTER — Telehealth (INDEPENDENT_AMBULATORY_CARE_PROVIDER_SITE_OTHER): Payer: 59 | Admitting: Unknown Physician Specialty

## 2022-09-22 DIAGNOSIS — J0110 Acute frontal sinusitis, unspecified: Secondary | ICD-10-CM

## 2022-09-22 MED ORDER — AMOXICILLIN-POT CLAVULANATE 875-125 MG PO TABS: 1.0000 | ORAL_TABLET | Freq: Two times a day (BID) | ORAL | 0 refills | Status: DC

## 2022-09-22 NOTE — Progress Notes (Addendum)
There were no vitals taken for this visit.   Subjective:    Patient ID: Benjamin Boone, male    DOB: December 02, 1955, 66 y.o.   MRN: [id number]  HPI: Benjamin Boone is a 66 y.o. male  Chief Complaint  Patient presents with   Sinus pain and pressure    For the past month   Due to the catastrophic nature of the COVID-19 pandemic, this visit was completed via audio and visual contact via [facility] due to the restrictions of the COVID-19 pandemic. All issues as above were discussed and addressed. Physical exam was done as above through visual confirmation on [facility] If it was felt that the patient should be evaluated in the office, they were directed there. The patient verbally consented to this visit. Location of the patient: home Location of the provider: work Those involved with this call:   Time spent on call: 10 miniutes with 10 minute chart review I verified patient identity using two factors (patient name and date of birth). Patient consents verbally to being seen via telemedicine visit today.  Sinusitis This is a new problem. The current episode started more than 1 month ago. The problem has been gradually worsening since onset. There has been no fever (Felt like he had a fever last night). Associated symptoms include congestion, headaches, a hoarse voice and sinus pressure. Pertinent negatives include no chills, coughing, diaphoresis, neck pain, shortness of breath, sore throat or swollen glands. Treatments tried: sinus rinses and antihistamines. The treatment provided no relief.     Relevant past medical, surgical, family and social history reviewed and updated as indicated. Interim medical history since our last visit reviewed. Allergies and medications reviewed and updated.  Review of Systems  Constitutional:  Negative for chills and diaphoresis.  HENT:  Positive for congestion, hoarse voice and sinus pressure. Negative for sore throat.   Respiratory:   Negative for cough and shortness of breath.   Musculoskeletal:  Negative for neck pain.  Neurological:  Positive for headaches.    Per HPI unless specifically indicated above     Objective:    There were no vitals taken for this visit.  Wt Readings from Last 3 Encounters:  10/08/21 162 lb 9.6 oz (73.8 kg)  06/02/21 157 lb (71.2 kg)  10/07/20 159 lb 3.2 oz (72.2 kg)    Physical Exam Constitutional:      General: He is not in acute distress.    Appearance: Normal appearance. He is well-developed.  HENT:     Head: Normocephalic and atraumatic.     Nose:     Comments: Sinus tenderness Eyes:     General: Lids are normal. No scleral icterus.       Right eye: No discharge.        Left eye: No discharge.     Conjunctiva/sclera: Conjunctivae normal.  Cardiovascular:     Rate and Rhythm: Normal rate.  Pulmonary:     Effort: Pulmonary effort is normal.  Abdominal:     Palpations: There is no hepatomegaly or splenomegaly.  Musculoskeletal:        General: Normal range of motion.  Skin:    Coloration: Skin is not pale.     Findings: No rash.  Neurological:     Mental Status: He is alert and oriented to person, place, and time.  Psychiatric:        Behavior: Behavior normal.        Thought Content: Thought content normal.  Judgment: Judgment normal.     Results for orders placed or performed in visit on 10/08/21  TSH  Result Value Ref Range   TSH 4.270 0.450 - 4.500 uIU/mL  PSA  Result Value Ref Range   Prostate Specific Ag, Serum 0.9 0.0 - 4.0 ng/mL  Lipid panel  Result Value Ref Range   Cholesterol, Total 218 (H) 100 - 199 mg/dL   Triglycerides 174 (H) 0 - 149 mg/dL   HDL 35 (L) >39 mg/dL   VLDL Cholesterol Cal 32 5 - 40 mg/dL   LDL Chol Calc (NIH) 151 (H) 0 - 99 mg/dL   Chol/HDL Ratio 6.2 (H) 0.0 - 5.0 ratio  CBC with Differential/Platelet  Result Value Ref Range   WBC 5.9 3.4 - 10.8 x10E3/uL   RBC 5.32 4.14 - 5.80 x10E6/uL   Hemoglobin 15.5 13.0 - 17.7  g/dL   Hematocrit 45.2 37.5 - 51.0 %   MCV 85 79 - 97 fL   MCH 29.1 26.6 - 33.0 pg   MCHC 34.3 31.5 - 35.7 g/dL   RDW 13.1 11.6 - 15.4 %   Platelets 336 150 - 450 x10E3/uL   Neutrophils 46 Not Estab. %   Lymphs 31 Not Estab. %   Monocytes 10 Not Estab. %   Eos 11 Not Estab. %   Basos 2 Not Estab. %   Neutrophils Absolute 2.7 1.4 - 7.0 x10E3/uL   Lymphocytes Absolute 1.8 0.7 - 3.1 x10E3/uL   Monocytes Absolute 0.6 0.1 - 0.9 x10E3/uL   EOS (ABSOLUTE) 0.7 (H) 0.0 - 0.4 x10E3/uL   Basophils Absolute 0.1 0.0 - 0.2 x10E3/uL   Immature Granulocytes 0 Not Estab. %   Immature Grans (Abs) 0.0 0.0 - 0.1 x10E3/uL  Comprehensive metabolic panel  Result Value Ref Range   Glucose 86 70 - 99 mg/dL   BUN 18 8 - 27 mg/dL   Creatinine, Ser 1.17 0.76 - 1.27 mg/dL   eGFR 70 >59 mL/min/1.73   BUN/Creatinine Ratio 15 10 - 24   Sodium 141 134 - 144 mmol/L   Potassium 4.2 3.5 - 5.2 mmol/L   Chloride 103 96 - 106 mmol/L   CO2 22 20 - 29 mmol/L   Calcium 9.7 8.6 - 10.2 mg/dL   Total Protein 7.1 6.0 - 8.5 g/dL   Albumin 4.6 3.8 - 4.8 g/dL   Globulin, Total 2.5 1.5 - 4.5 g/dL   Albumin/Globulin Ratio 1.8 1.2 - 2.2   Bilirubin Total 0.3 0.0 - 1.2 mg/dL   Alkaline Phosphatase 64 44 - 121 IU/L   AST 24 0 - 40 IU/L   ALT 21 0 - 44 IU/L  Urinalysis, Routine w reflex microscopic  Result Value Ref Range   Specific Gravity, UA >1.030 (H) 1.005 - 1.030   pH, UA 5.0 5.0 - 7.5   Color, UA Yellow Yellow   Appearance Ur Clear Clear   Leukocytes,UA Negative Negative   Protein,UA Negative Negative/Trace   Glucose, UA Negative Negative   Ketones, UA Negative Negative   RBC, UA Negative Negative   Bilirubin, UA Negative Negative   Urobilinogen, Ur 0.2 0.2 - 1.0 mg/dL   Nitrite, UA Negative Negative      Assessment & Plan:   Problem List Items Addressed This Visit   None Visit Diagnoses     Acute non-recurrent frontal sinusitis    -  Primary   symptoms consistent with secondary sinus infections.   Discussed saline rinses nd Flonase.  Rx for Augmentin 875 mg BID.  No allergies   Relevant Medications   amoxicillin-clavulanate (AUGMENTIN) 875-125 MG tablet        Follow up plan: Return if symptoms worsen or fail to improve.

## 2022-10-08 NOTE — Progress Notes (Signed)
BP 126/82   Pulse 75   Temp (!) 97.5 F (36.4 C) (Oral)   Ht _0  (1.651 m)   Wt 160 lb 14.4 oz (73 kg)   SpO2 97%   BMI 26.78 kg/m    Subjective:    Patient ID: Benjamin Boone, male    DOB: December 22, 1955, 66 y.o.   MRN: [id number]  HPI: Benjamin Boone is a 66 y.o. male presenting on 10/09/2022 for comprehensive medical examination. Current medical complaints include:none  He currently lives with: Interim Problems from his last visit: no  PSORIATIC ARTHRITIS Patient does not see any specialists for this.  No current issues. Denies concerns at visit.  Does have some stiffness but usually improves on its own.  HYPERLIPIDEMIA Hyperlipidemia status: excellent compliance Satisfied with current treatment?  yes Side effects:  no Medication compliance: excellent compliance Past cholesterol meds: gemfibrozil Supplements: none Aspirin:  no The 10-year ASCVD risk score (Arnett DK, et al., 2019) is: 15.1%   Values used to calculate the score:     Age: 38 years     Sex: Male     Is Non-Hispanic African American: No     Diabetic: No     Tobacco smoker: No     Systolic Blood Pressure: 350 mmHg     Is BP treated: No     HDL Cholesterol: 35 mg/dL     Total Cholesterol: 218 mg/dL Chest pain:  no Coronary artery disease:  no Family history CAD:  no Family history early CAD:  no   Denies HA, CP, SOB, dizziness, palpitations, visual changes, and lower extremity swelling.   Depression Screen done today and results listed below:     10/09/2022    8:06 AM 10/08/2021    8:16 AM 09/24/2021    1:34 PM 10/07/2020    8:21 AM 10/04/2019    1:57 PM  Depression screen PHQ 2/9  Decreased Interest 0 0 0 0 0  Down, Depressed, Hopeless 0 0 0 0 0  PHQ - 2 Score 0 0 0 0 0  Altered sleeping 0 0 0  1  Tired, decreased energy 0 0 0  0  Change in appetite 0 0 0  0  Feeling bad or failure about yourself  0 0 0  0  Trouble concentrating 0 0 0  0  Moving slowly or  fidgety/restless 0 0 0  0  Suicidal thoughts 0 0 0  0  PHQ-9 Score 0 0 0  1  Difficult doing work/chores Not difficult at all Not difficult at all Not difficult at all      The patient does not have a history of falls. I did not complete a risk assessment for falls. A plan of care for falls was documented.   Past Medical History:  Past Medical History:  Diagnosis Date   Allergy    Arthritis    Hyperlipidemia    Venous insufficiency     Surgical History:  Past Surgical History:  Procedure Laterality Date   COLONOSCOPY WITH PROPOFOL N/A 06/02/2021   Procedure: COLONOSCOPY WITH BIOPSY;  Surgeon: Lucilla Lame, MD;  Location: [facility];  Service: Endoscopy;  Laterality: N/A;   POLYPECTOMY N/A 06/02/2021   Procedure: POLYPECTOMY;  Surgeon: Lucilla Lame, MD;  Location: [facility];  Service: Endoscopy;  Laterality: N/A;   TONSILLECTOMY     TONSILLECTOMY      Medications:  Current Outpatient Medications on File Prior to Visit  Medication Sig  Multiple Vitamin (MULTIVITAMIN) tablet Take 1 tablet by mouth daily.   albuterol (VENTOLIN HFA) 108 (90 Base) MCG/ACT inhaler TAKE 2 PUFFS BY MOUTH EVERY 6 HOURS AS NEEDED FOR WHEEZE OR SHORTNESS OF BREATH (Patient not taking: Reported on 10/09/2022)   azelastine (ASTELIN) 0.1 % nasal spray Place 1 spray into both nostrils 2 (two) times daily. Use in each nostril as directed (Patient not taking: Reported on 10/09/2022)   fexofenadine (ALLEGRA ALLERGY) 180 MG tablet Take 1 tablet (180 mg total) by mouth daily. (Patient not taking: Reported on 10/08/2021)   No current facility-administered medications on file prior to visit.    Allergies:  Allergies  Allergen Reactions   Doxycycline Rash    Social History:  Social History   Socioeconomic History   Marital status: Married    Spouse name: Not on file   Number of children: Not on file   Years of education: Not on file   Highest education level: Not on file  Occupational  History   Not on file  Tobacco Use   Smoking status: Never   Smokeless tobacco: Never  Vaping Use   Vaping Use: Never used  Substance and Sexual Activity   Alcohol use: No   Drug use: No   Sexual activity: Yes  Other Topics Concern   Not on file  Social History Narrative   Not on file   Social Determinants of Health   Financial Resource Strain: Not on file  Food Insecurity: Not on file  Transportation Needs: Not on file  Physical Activity: Not on file  Stress: Not on file  Social Connections: Not on file  Intimate Partner Violence: Not on file   Social History   Tobacco Use  Smoking Status Never  Smokeless Tobacco Never   Social History   Substance and Sexual Activity  Alcohol Use No    Family History:  Family History  Problem Relation Age of Onset   Heart disease Father        MI   Heart disease Mother        pacemaker   Heart attack Maternal Uncle    Prostate cancer Paternal Uncle     Past medical history, surgical history, medications, allergies, family history and social history reviewed with patient today and changes made to appropriate areas of the chart.   Review of Systems  Eyes:  Negative for blurred vision and double vision.  Respiratory:  Negative for shortness of breath.   Cardiovascular:  Negative for chest pain, palpitations and leg swelling.  Neurological:  Negative for dizziness and headaches.   All other ROS negative except what is listed above and in the HPI.      Objective:    BP 126/82   Pulse 75   Temp (!) 97.5 F (36.4 C) (Oral)   Ht _0  (1.651 m)   Wt 160 lb 14.4 oz (73 kg)   SpO2 97%   BMI 26.78 kg/m   Wt Readings from Last 3 Encounters:  10/09/22 160 lb 14.4 oz (73 kg)  10/08/21 162 lb 9.6 oz (73.8 kg)  06/02/21 157 lb (71.2 kg)    Physical Exam Vitals and nursing note reviewed.  Constitutional:      General: He is not in acute distress.    Appearance: Normal appearance. He is not ill-appearing, toxic-appearing  or diaphoretic.  HENT:     Head: Normocephalic.     Right Ear: Tympanic membrane, ear canal and external ear normal.  Left Ear: Tympanic membrane, ear canal and external ear normal.     Nose: Nose normal. No congestion or rhinorrhea.     Mouth/Throat:     Mouth: Mucous membranes are moist.  Eyes:     General:        Right eye: No discharge.        Left eye: No discharge.     Extraocular Movements: Extraocular movements intact.     Conjunctiva/sclera: Conjunctivae normal.     Pupils: Pupils are equal, round, and reactive to light.  Cardiovascular:     Rate and Rhythm: Normal rate and regular rhythm.     Heart sounds: No murmur heard. Pulmonary:     Effort: Pulmonary effort is normal. No respiratory distress.     Breath sounds: Normal breath sounds. No wheezing, rhonchi or rales.  Abdominal:     General: Abdomen is flat. Bowel sounds are normal. There is no distension.     Palpations: Abdomen is soft.     Tenderness: There is no abdominal tenderness. There is no guarding.  Musculoskeletal:     Cervical back: Normal range of motion and neck supple.  Skin:    General: Skin is warm and dry.     Capillary Refill: Capillary refill takes less than 2 seconds.  Neurological:     General: No focal deficit present.     Mental Status: He is alert and oriented to person, place, and time.     Cranial Nerves: No cranial nerve deficit.     Motor: No weakness.     Deep Tendon Reflexes: Reflexes normal.  Psychiatric:        Mood and Affect: Mood normal.        Behavior: Behavior normal.        Thought Content: Thought content normal.        Judgment: Judgment normal.     Results for orders placed or performed in visit on 10/08/21  TSH  Result Value Ref Range   TSH 4.270 0.450 - 4.500 uIU/mL  PSA  Result Value Ref Range   Prostate Specific Ag, Serum 0.9 0.0 - 4.0 ng/mL  Lipid panel  Result Value Ref Range   Cholesterol, Total 218 (H) 100 - 199 mg/dL   Triglycerides 174 (H) 0 -  149 mg/dL   HDL 35 (L) >39 mg/dL   VLDL Cholesterol Cal 32 5 - 40 mg/dL   LDL Chol Calc (NIH) 151 (H) 0 - 99 mg/dL   Chol/HDL Ratio 6.2 (H) 0.0 - 5.0 ratio  CBC with Differential/Platelet  Result Value Ref Range   WBC 5.9 3.4 - 10.8 x10E3/uL   RBC 5.32 4.14 - 5.80 x10E6/uL   Hemoglobin 15.5 13.0 - 17.7 g/dL   Hematocrit 45.2 37.5 - 51.0 %   MCV 85 79 - 97 fL   MCH 29.1 26.6 - 33.0 pg   MCHC 34.3 31.5 - 35.7 g/dL   RDW 13.1 11.6 - 15.4 %   Platelets 336 150 - 450 x10E3/uL   Neutrophils 46 Not Estab. %   Lymphs 31 Not Estab. %   Monocytes 10 Not Estab. %   Eos 11 Not Estab. %   Basos 2 Not Estab. %   Neutrophils Absolute 2.7 1.4 - 7.0 x10E3/uL   Lymphocytes Absolute 1.8 0.7 - 3.1 x10E3/uL   Monocytes Absolute 0.6 0.1 - 0.9 x10E3/uL   EOS (ABSOLUTE) 0.7 (H) 0.0 - 0.4 x10E3/uL   Basophils Absolute 0.1 0.0 - 0.2 x10E3/uL   Immature  Granulocytes 0 Not Estab. %   Immature Grans (Abs) 0.0 0.0 - 0.1 x10E3/uL  Comprehensive metabolic panel  Result Value Ref Range   Glucose 86 70 - 99 mg/dL   BUN 18 8 - 27 mg/dL   Creatinine, Ser 1.17 0.76 - 1.27 mg/dL   eGFR 70 >59 mL/min/1.73   BUN/Creatinine Ratio 15 10 - 24   Sodium 141 134 - 144 mmol/L   Potassium 4.2 3.5 - 5.2 mmol/L   Chloride 103 96 - 106 mmol/L   CO2 22 20 - 29 mmol/L   Calcium 9.7 8.6 - 10.2 mg/dL   Total Protein 7.1 6.0 - 8.5 g/dL   Albumin 4.6 3.8 - 4.8 g/dL   Globulin, Total 2.5 1.5 - 4.5 g/dL   Albumin/Globulin Ratio 1.8 1.2 - 2.2   Bilirubin Total 0.3 0.0 - 1.2 mg/dL   Alkaline Phosphatase 64 44 - 121 IU/L   AST 24 0 - 40 IU/L   ALT 21 0 - 44 IU/L  Urinalysis, Routine w reflex microscopic  Result Value Ref Range   Specific Gravity, UA >1.030 (H) 1.005 - 1.030   pH, UA 5.0 5.0 - 7.5   Color, UA Yellow Yellow   Appearance Ur Clear Clear   Leukocytes,UA Negative Negative   Protein,UA Negative Negative/Trace   Glucose, UA Negative Negative   Ketones, UA Negative Negative   RBC, UA Negative Negative    Bilirubin, UA Negative Negative   Urobilinogen, Ur 0.2 0.2 - 1.0 mg/dL   Nitrite, UA Negative Negative      Assessment & Plan:   Problem List Items Addressed This Visit       Cardiovascular and Mediastinum   Dilated aortic root ([place]) - Primary    Has followed with Cardiology in past - last Echocardiogram in 2018.  Will need follow up echocardiogram in next couple of years most likely.        Relevant Medications   gemfibrozil (LOPID) 600 MG tablet     Musculoskeletal and Integument   Psoriatic arthritis (HCC)    Chronic.  Controlled without medication. Labs ordered today.  Return to clinic in 6 months for reevaluation.  Call sooner if concerns arise.          Other   Hyperlipidemia    Chronic.  Controlled.  Continue with current medication regimen of Gemfibrozil.  Refills sent today.  Labs ordered today.  Return to clinic in 6 months for reevaluation.  Call sooner if concerns arise.        Relevant Medications   gemfibrozil (LOPID) 600 MG tablet   Other Relevant Orders   Lipid panel   Other Visit Diagnoses     Annual physical exam       Relevant Orders   TSH   PSA   Lipid panel   CBC with Differential/Platelet   Comprehensive metabolic panel   Urinalysis, Routine w reflex microscopic        Discussed aspirin prophylaxis for myocardial infarction prevention and decision was it was not indicated  LABORATORY TESTING:  Health maintenance labs ordered today as discussed above.   The natural history of prostate cancer and ongoing controversy regarding screening and potential treatment outcomes of prostate cancer has been discussed with the patient. The meaning of a false positive PSA and a false negative PSA has been discussed. He indicates understanding of the limitations of this screening test and wishes to proceed with screening PSA testing.   IMMUNIZATIONS:   - Tdap: Tetanus vaccination status  reviewed: last tetanus booster within 10 years. - Influenza:  Administered today - Pneumovax: Not applicable - Prevnar: Not applicable - COVID: Refused - HPV: Not applicable - Shingrix vaccine:  Never had chicken pox  SCREENING: - Colonoscopy: Up to date  Discussed with patient purpose of the colonoscopy is to detect colon cancer at curable precancerous or early stages   - AAA Screening: Not applicable  -Hearing Test: Not applicable  -Spirometry: Not applicable   PATIENT COUNSELING:    Sexuality: Discussed sexually transmitted diseases, partner selection, use of condoms, avoidance of unintended pregnancy  and contraceptive alternatives.   Advised to avoid cigarette smoking.  I discussed with the patient that most people either abstain from alcohol or drink within safe limits (<=14/week and <=4 drinks/occasion for males, <=7/weeks and <= 3 drinks/occasion for females) and that the risk for alcohol disorders and other health effects rises proportionally with the number of drinks per week and how often a drinker exceeds daily limits.  Discussed cessation/primary prevention of drug use and availability of treatment for abuse.   Diet: Encouraged to adjust caloric intake to maintain  or achieve ideal body weight, to reduce intake of dietary saturated fat and total fat, to limit sodium intake by avoiding high sodium foods and not adding table salt, and to maintain adequate dietary potassium and calcium preferably from fresh fruits, vegetables, and low-fat dairy products.    stressed the importance of regular exercise  Injury prevention: Discussed safety belts, safety helmets, smoke detector, smoking near bedding or upholstery.   Dental health: Discussed importance of regular tooth brushing, flossing, and dental visits.   Follow up plan: NEXT PREVENTATIVE PHYSICAL DUE IN 1 YEAR. Return in about 6 months (around 04/09/2023) for HTN, HLD, DM2 FU.

## 2022-10-09 ENCOUNTER — Encounter: Payer: Self-pay | Admitting: Nurse Practitioner

## 2022-10-09 ENCOUNTER — Ambulatory Visit (INDEPENDENT_AMBULATORY_CARE_PROVIDER_SITE_OTHER): Payer: 59 | Admitting: Nurse Practitioner

## 2022-10-09 VITALS — BP 126/82 | HR 75 | Temp 97.5°F | Ht 65.0 in | Wt 160.9 lb

## 2022-10-09 DIAGNOSIS — L4050 Arthropathic psoriasis, unspecified: Secondary | ICD-10-CM

## 2022-10-09 DIAGNOSIS — Z0000 Encounter for general adult medical examination without abnormal findings: Secondary | ICD-10-CM | POA: Diagnosis not present

## 2022-10-09 DIAGNOSIS — E782 Mixed hyperlipidemia: Secondary | ICD-10-CM

## 2022-10-09 DIAGNOSIS — I77810 Thoracic aortic ectasia: Secondary | ICD-10-CM

## 2022-10-09 LAB — URINALYSIS, ROUTINE W REFLEX MICROSCOPIC
Bilirubin, UA: NEGATIVE
Glucose, UA: NEGATIVE
Ketones, UA: NEGATIVE
Leukocytes,UA: NEGATIVE
Nitrite, UA: NEGATIVE
Protein,UA: NEGATIVE
RBC, UA: NEGATIVE
Specific Gravity, UA: 1.025 (ref 1.005–1.030)
Urobilinogen, Ur: 0.2 mg/dL (ref 0.2–1.0)
pH, UA: 5.5 (ref 5.0–7.5)

## 2022-10-09 MED ORDER — FLUTICASONE PROPIONATE 50 MCG/ACT NA SUSP: 2.0000 | Freq: Two times a day (BID) | NASAL | 1 refills | Status: DC

## 2022-10-09 MED ORDER — GEMFIBROZIL 600 MG PO TABS: ORAL_TABLET | ORAL | 1 refills | Status: DC

## 2022-10-09 NOTE — Assessment & Plan Note (Signed)
Chronic.  Controlled.  Continue with current medication regimen of Gemfibrozil.  Refills sent today.  Labs ordered today.  Return to clinic in 6 months for reevaluation.  Call sooner if concerns arise.

## 2022-10-09 NOTE — Assessment & Plan Note (Signed)
Chronic.  Controlled without medication..  Labs ordered today.  Return to clinic in 6 months for reevaluation.  Call sooner if concerns arise.  ° °

## 2022-10-09 NOTE — Assessment & Plan Note (Signed)
Has followed with Cardiology in past - last Echocardiogram in 2018.  Will need follow up echocardiogram in next couple of years most likely.

## 2022-10-10 LAB — LIPID PANEL
Chol/HDL Ratio: 5.4 ratio — ABNORMAL HIGH (ref 0.0–5.0)
Cholesterol, Total: 214 mg/dL — ABNORMAL HIGH (ref 100–199)
HDL: 40 mg/dL (ref >39)
LDL Chol Calc (NIH): 153 mg/dL — ABNORMAL HIGH (ref 0–99)
Triglycerides: 114 mg/dL (ref 0–149)
VLDL Cholesterol Cal: 21 mg/dL (ref 5–40)

## 2022-10-10 LAB — COMPREHENSIVE METABOLIC PANEL
ALT: 22 IU/L (ref 0–44)
AST: 18 IU/L (ref 0–40)
Albumin/Globulin Ratio: 1.8 (ref 1.2–2.2)
Albumin: 4.3 g/dL (ref 3.9–4.9)
Alkaline Phosphatase: 93 IU/L (ref 44–121)
BUN/Creatinine Ratio: 12 (ref 10–24)
BUN: 13 mg/dL (ref 8–27)
Bilirubin Total: 0.5 mg/dL (ref 0.0–1.2)
CO2: 22 mmol/L (ref 20–29)
Calcium: 9.4 mg/dL (ref 8.6–10.2)
Chloride: 103 mmol/L (ref 96–106)
Creatinine, Ser: 1.13 mg/dL (ref 0.76–1.27)
Globulin, Total: 2.4 g/dL (ref 1.5–4.5)
Glucose: 84 mg/dL (ref 70–99)
Potassium: 4.1 mmol/L (ref 3.5–5.2)
Sodium: 140 mmol/L (ref 134–144)
Total Protein: 6.7 g/dL (ref 6.0–8.5)
eGFR: 73 mL/min/{1.73_m2} (ref >59)

## 2022-10-10 LAB — CBC WITH DIFFERENTIAL/PLATELET
Basophils Absolute: 0.1 10*3/uL (ref 0.0–0.2)
Basos: 2 %
EOS (ABSOLUTE): 0.6 10*3/uL — ABNORMAL HIGH (ref 0.0–0.4)
Eos: 8 %
Hematocrit: 45.5 % (ref 37.5–51.0)
Hemoglobin: 15 g/dL (ref 13.0–17.7)
Immature Grans (Abs): 0 10*3/uL (ref 0.0–0.1)
Immature Granulocytes: 0 %
Lymphocytes Absolute: 1.9 10*3/uL (ref 0.7–3.1)
Lymphs: 28 %
MCH: 28.2 pg (ref 26.6–33.0)
MCHC: 33 g/dL (ref 31.5–35.7)
MCV: 86 fL (ref 79–97)
Monocytes Absolute: 0.7 10*3/uL (ref 0.1–0.9)
Monocytes: 10 %
Neutrophils Absolute: 3.6 10*3/uL (ref 1.4–7.0)
Neutrophils: 52 %
Platelets: 337 10*3/uL (ref 150–450)
RBC: 5.31 x10E6/uL (ref 4.14–5.80)
RDW: 13.5 % (ref 11.6–15.4)
WBC: 6.8 10*3/uL (ref 3.4–10.8)

## 2022-10-10 LAB — TSH: TSH: 2.83 u[IU]/mL (ref 0.450–4.500)

## 2022-10-10 LAB — PSA: Prostate Specific Ag, Serum: 1.1 ng/mL (ref 0.0–4.0)

## 2022-10-12 NOTE — Progress Notes (Signed)
Hi Benjamin Boone. It was nice to see you last week.  Your lab work looks good.  Cholesterol remains relatively the same.  No concerns at this time. Continue with your current medication regimen.  Follow up as discussed.  Please let me know if you have any questions.

## 2022-10-19 ENCOUNTER — Other Ambulatory Visit: Payer: Self-pay | Admitting: Nurse Practitioner

## 2022-10-20 NOTE — Telephone Encounter (Signed)
Requested Prescriptions  Pending Prescriptions Disp Refills   fluticasone (FLONASE) 50 MCG/ACT nasal spray [Pharmacy Med Name: FLUTICASONE PROP 50 MCG SPRAY] 96 mL 1    Sig: PLACE 2 SPRAYS INTO BOTH NOSTRILS 2 (TWO) TIMES DAILY     Ear, Nose, and Throat: Nasal Preparations - Corticosteroids Passed - 10/19/2022 11:30 AM      Passed - Valid encounter within last 12 months    Recent Outpatient Visits           1 week ago Annual physical exam   [facility] Larae Grooms, NP   4 weeks ago Acute non-recurrent frontal sinusitis   [facility] Gabriel Cirri, NP   1 year ago Annual physical exam   [facility] Larae Grooms, NP   1 year ago Acute recurrent sinusitis, unspecified location   [facility], Avanti, MD   1 year ago Acute non-recurrent frontal sinusitis   [facility] Larae Grooms, NP       Future Appointments             In 5 months Larae Grooms, NP [facility]

## 2022-11-18 ENCOUNTER — Telehealth: Payer: 59 | Admitting: Physician Assistant

## 2022-11-18 ENCOUNTER — Ambulatory Visit: Payer: Self-pay | Admitting: *Deleted

## 2022-11-18 DIAGNOSIS — J019 Acute sinusitis, unspecified: Secondary | ICD-10-CM

## 2022-11-18 DIAGNOSIS — B9689 Other specified bacterial agents as the cause of diseases classified elsewhere: Secondary | ICD-10-CM | POA: Diagnosis not present

## 2022-11-18 MED ORDER — AMOXICILLIN-POT CLAVULANATE 875-125 MG PO TABS: 1.0000 | ORAL_TABLET | Freq: Two times a day (BID) | ORAL | 0 refills | Status: DC

## 2022-11-18 MED ORDER — BENZONATATE 100 MG PO CAPS: 100.0000 mg | ORAL_CAPSULE | Freq: Three times a day (TID) | ORAL | 0 refills | Status: DC | PRN

## 2022-11-18 NOTE — Telephone Encounter (Signed)
Summary: poss sinus infection   Having sinus issues.  Pt states he had sinus infection in October, and this is the same thing, would like appt. Pt have pain/pressure        Chief Complaint: Sinus pain cough Symptoms: Greenish drainage, cough sinus pain, earache, congestion Frequency: Saturday Pertinent Negatives: Patient denies sob Disposition: [] ED /[] Urgent Care (no appt availability in office) / [] Appointment(In office/virtual)/ [x]  [facility]/ [] Home Care/ [] Refused Recommended Disposition /[] [place] [facility]/ []  Follow-up with PCP Additional Notes: No availability, [facility] secured for pt. Care advise provided, pt verbalizes understanding.  Reason for Disposition  Earache  Answer Assessment - Initial Assessment Questions 1. LOCATION: "Where does it hurt?"      Sinus area , frontal headache 2. ONSET: "When did the sinus pain start?"  (e.g., hours, days)      Saturday 3. SEVERITY: "How bad is the pain?"   (Scale 1-10; mild, moderate or severe)   - MILD (1-3): doesn't interfere with normal activities    - MODERATE (4-7): interferes with normal activities (e.g., work or school) or awakens from sleep   - SEVERE (8-10): excruciating pain and patient unable to do any normal activities        moderate 4. RECURRENT SYMPTOM: "Have you ever had sinus problems before?" If Yes, ask: "When was the last time?" and "What happened that time?"      Oct. 5. NASAL CONGESTION: "Is the nose blocked?" If Yes, ask: "Can you open it or must you breathe through your mouth?"     Congestion, lots of drainage 6. NASAL DISCHARGE: "Do you have discharge from your nose?" If so ask, "What color?"     Yellow greenish 7. FEVER: "Do you have a fever?" If Yes, ask: "What is it, how was it measured, and when did it start?"      Maybe during night, hot, chills 8. OTHER SYMPTOMS: "Do you have any other symptoms?" (e.g., sore throat, cough, earache, difficulty breathing)     Earache both L>R   Cough, greenish  Protocols used: Sinus Pain or Congestion-A-AH

## 2022-11-18 NOTE — Patient Instructions (Signed)
Benjamin Boone, thank you for joining Piedad Climes, PA-C for today's virtual visit.  While this provider is not your primary care provider (PCP), if your PCP is located in our provider database this encounter information will be shared with them immediately following your visit.   A [facility] MyChart account gives you access to today's visit and all your visits, tests, and labs performed at [facility] " click here if you don't have a [facility] MyChart account or go to [email or web address]  Consent: (Patient) Benjamin Boone provided verbal consent for this virtual visit at the beginning of the encounter.  Current Medications:  Current Outpatient Medications:    albuterol (VENTOLIN HFA) 108 (90 Base) MCG/ACT inhaler, TAKE 2 PUFFS BY MOUTH EVERY 6 HOURS AS NEEDED FOR WHEEZE OR SHORTNESS OF BREATH (Patient not taking: Reported on 10/09/2022), Disp: 18 g, Rfl: 2   azelastine (ASTELIN) 0.1 % nasal spray, Place 1 spray into both nostrils 2 (two) times daily. Use in each nostril as directed (Patient not taking: Reported on 10/09/2022), Disp: 30 mL, Rfl: 2   fexofenadine (ALLEGRA ALLERGY) 180 MG tablet, Take 1 tablet (180 mg total) by mouth daily. (Patient not taking: Reported on 10/08/2021), Disp: 10 tablet, Rfl: 1   fluticasone (FLONASE) 50 MCG/ACT nasal spray, PLACE 2 SPRAYS INTO BOTH NOSTRILS 2 (TWO) TIMES DAILY, Disp: 96 mL, Rfl: 1   gemfibrozil (LOPID) 600 MG tablet, TAKE 1 TABLET BY MOUTH TWICE A DAY BEFORE A MEAL, Disp: 180 tablet, Rfl: 1   Multiple Vitamin (MULTIVITAMIN) tablet, Take 1 tablet by mouth daily., Disp: , Rfl:    Medications ordered in this encounter:  No orders of the defined types were placed in this encounter.    *If you need refills on other medications prior to your next appointment, please contact your pharmacy*  Follow-Up: Call back or seek an in-person evaluation if the symptoms worsen or if the condition fails to improve as  anticipated.  [facility] [phone]  Other Instructions Please take antibiotic as directed.  Increase fluid intake.  Use Saline nasal spray.  Take a daily multivitamin. Use the Tessalon as directed..  Place a humidifier in the bedroom.  Please call or return clinic if symptoms are not improving.  Sinusitis Sinusitis is redness, soreness, and swelling (inflammation) of the paranasal sinuses. Paranasal sinuses are air pockets within the bones of your face (beneath the eyes, the middle of the forehead, or above the eyes). In healthy paranasal sinuses, mucus is able to drain out, and air is able to circulate through them by way of your nose. However, when your paranasal sinuses are inflamed, mucus and air can become trapped. This can allow bacteria and other germs to grow and cause infection. Sinusitis can develop quickly and last only a short time (acute) or continue over a long period (chronic). Sinusitis that lasts for more than 12 weeks is considered chronic.  CAUSES  Causes of sinusitis include: Allergies. Structural abnormalities, such as displacement of the cartilage that separates your nostrils (deviated septum), which can decrease the air flow through your nose and sinuses and affect sinus drainage. Functional abnormalities, such as when the small hairs (cilia) that line your sinuses and help remove mucus do not work properly or are not present. SYMPTOMS  Symptoms of acute and chronic sinusitis are the same. The primary symptoms are pain and pressure around the affected sinuses. Other symptoms include: Upper toothache. Earache. Headache. Bad breath. Decreased sense of smell and  taste. A cough, which worsens when you are lying flat. Fatigue. Fever. Thick drainage from your nose, which often is green and may contain pus (purulent). Swelling and warmth over the affected sinuses. DIAGNOSIS  Your caregiver will perform a physical exam. During the exam, your caregiver  may: Look in your nose for signs of abnormal growths in your nostrils (nasal polyps). Tap over the affected sinus to check for signs of infection. View the inside of your sinuses (endoscopy) with a special imaging device with a light attached (endoscope), which is inserted into your sinuses. If your caregiver suspects that you have chronic sinusitis, one or more of the following tests may be recommended: Allergy tests. Nasal culture A sample of mucus is taken from your nose and sent to a lab and screened for bacteria. Nasal cytology A sample of mucus is taken from your nose and examined by your caregiver to determine if your sinusitis is related to an allergy. TREATMENT  Most cases of acute sinusitis are related to a viral infection and will resolve on their own within 10 days. Sometimes medicines are prescribed to help relieve symptoms (pain medicine, decongestants, nasal steroid sprays, or saline sprays).  However, for sinusitis related to a bacterial infection, your caregiver will prescribe antibiotic medicines. These are medicines that will help kill the bacteria causing the infection.  Rarely, sinusitis is caused by a fungal infection. In theses cases, your caregiver will prescribe antifungal medicine. For some cases of chronic sinusitis, surgery is needed. Generally, these are cases in which sinusitis recurs more than 3 times per year, despite other treatments. HOME CARE INSTRUCTIONS  Drink plenty of water. Water helps thin the mucus so your sinuses can drain more easily. Use a humidifier. Inhale steam 3 to 4 times a day (for example, sit in the bathroom with the shower running). Apply a warm, moist washcloth to your face 3 to 4 times a day, or as directed by your caregiver. Use saline nasal sprays to help moisten and clean your sinuses. Take over-the-counter or prescription medicines for pain, discomfort, or fever only as directed by your caregiver. SEEK IMMEDIATE MEDICAL CARE IF: You  have increasing pain or severe headaches. You have nausea, vomiting, or drowsiness. You have swelling around your face. You have vision problems. You have a stiff neck. You have difficulty breathing. MAKE SURE YOU:  Understand these instructions. Will watch your condition. Will get help right away if you are not doing well or get worse. Document Released: 11/09/2005 Document Revised: 02/01/2012 Document Reviewed: 11/24/2011 [facility] Patient Information 2014 [place], [place].    If you have been instructed to have an in-person evaluation today at a local Urgent Care facility, please use the link below. It will take you to a list of all of our available [facility], including address, phone number and hours of operation. Please do not delay care.  [facility]  If you or a family member do not have a primary care provider, use the link below to schedule a visit and establish care. When you choose a [facility] primary care physician or advanced practice provider, you gain a long-term partner in health. Find a Primary Care Provider  Learn more about [facility]'s in-office and virtual care options:  - Get Care Now

## 2022-11-18 NOTE — Progress Notes (Signed)
Virtual Visit Consent   Benjamin Boone, you are scheduled for a virtual visit with a [facility] provider today. Just as with appointments in the office, your consent must be obtained to participate. Your consent will be active for this visit and any virtual visit you may have with one of our providers in the next 365 days. If you have a MyChart account, a copy of this consent can be sent to you electronically.  As this is a virtual visit, video technology does not allow for your provider to perform a traditional examination. This may limit your provider's ability to fully assess your condition. If your provider identifies any concerns that need to be evaluated in person or the need to arrange testing (such as labs, EKG, etc.), we will make arrangements to do so. Although advances in technology are sophisticated, we cannot ensure that it will always work on either your end or our end. If the connection with a video visit is poor, the visit may have to be switched to a telephone visit. With either a video or telephone visit, we are not always able to ensure that we have a secure connection.  By engaging in this virtual visit, you consent to the provision of healthcare and authorize for your insurance to be billed (if applicable) for the services provided during this visit. Depending on your insurance coverage, you may receive a charge related to this service.  I need to obtain your verbal consent now. Are you willing to proceed with your visit today? Benjamin Boone has provided verbal consent on 11/18/2022 for a virtual visit (video or telephone). Piedad Climes, [place]  Date: 11/18/2022 6:51 PM  Virtual Visit via Video Note   I, Piedad Climes, connected with  Benjamin Boone  ([id number], 2000-08-24) on 11/18/22 at  6:45 PM EST by a video-enabled telemedicine application and verified that I am speaking with the correct person using two  identifiers.  Location: Patient: Virtual Visit Location Patient: Home Provider: Virtual Visit Location Provider: Home Office   I discussed the limitations of evaluation and management by telemedicine and the availability of in person appointments. The patient expressed understanding and agreed to proceed.    History of Present Illness: Benjamin Boone is a 66 y.o. who identifies as a male who was assigned male at birth, and is being seen today for nasal and head congestion with drainage and a low-grade fever for the past 6 days. Drainage has gone from clear to dark green. Now with associated ear pressure and L ear pain. Notes frontal sinus pain.  Prone to sinus infections. Is taking OTC Mucinex.    HPI: HPI  Problems:  Patient Active Problem List   Diagnosis Date Noted   Rectal polyp    Sleep disturbance 10/07/2020   Dilated aortic root (HCC) 02/20/2017   Recurrent sinusitis 01/06/2017   Psoriatic arthritis (HCC) 10/18/2015   Allergic rhinitis 10/18/2015   Hyperlipidemia 10/18/2015   Venous insufficiency 10/18/2015   Asthma 10/18/2015    Allergies:  Allergies  Allergen Reactions   Doxycycline Rash   Medications:  Current Outpatient Medications:    amoxicillin-clavulanate (AUGMENTIN) 875-125 MG tablet, Take 1 tablet by mouth 2 (two) times daily., Disp: 14 tablet, Rfl: 0   benzonatate (TESSALON) 100 MG capsule, Take 1 capsule (100 mg total) by mouth 3 (three) times daily as needed for cough., Disp: 30 capsule, Rfl: 0   albuterol (VENTOLIN HFA) 108 (90 Base) MCG/ACT inhaler, TAKE 2 PUFFS BY  MOUTH EVERY 6 HOURS AS NEEDED FOR WHEEZE OR SHORTNESS OF BREATH (Patient not taking: Reported on 10/09/2022), Disp: 18 g, Rfl: 2   azelastine (ASTELIN) 0.1 % nasal spray, Place 1 spray into both nostrils 2 (two) times daily. Use in each nostril as directed (Patient not taking: Reported on 10/09/2022), Disp: 30 mL, Rfl: 2   fexofenadine (ALLEGRA ALLERGY) 180 MG tablet, Take 1 tablet (180  mg total) by mouth daily. (Patient not taking: Reported on 10/08/2021), Disp: 10 tablet, Rfl: 1   fluticasone (FLONASE) 50 MCG/ACT nasal spray, PLACE 2 SPRAYS INTO BOTH NOSTRILS 2 (TWO) TIMES DAILY, Disp: 96 mL, Rfl: 1   gemfibrozil (LOPID) 600 MG tablet, TAKE 1 TABLET BY MOUTH TWICE A DAY BEFORE A MEAL, Disp: 180 tablet, Rfl: 1   Multiple Vitamin (MULTIVITAMIN) tablet, Take 1 tablet by mouth daily., Disp: , Rfl:   Observations/Objective: No labored breathing. Speech is clear and coherent with logical content.  Patient is alert and oriented at baseline.   Assessment and Plan: 1. Acute bacterial sinusitis - amoxicillin-clavulanate (AUGMENTIN) 875-125 MG tablet; Take 1 tablet by mouth 2 (two) times daily.  Dispense: 14 tablet; Refill: 0 - benzonatate (TESSALON) 100 MG capsule; Take 1 capsule (100 mg total) by mouth 3 (three) times daily as needed for cough.  Dispense: 30 capsule; Refill: 0  Rx Augmentin.  Increase fluids.  Rest.  Saline nasal spray.  Probiotic.  Mucinex as directed.  Humidifier in bedroom. Tessalon per orders. Continue Flonase and OTC allergy medications.  Call or return to clinic if symptoms are not improving.   Follow Up Instructions: I discussed the assessment and treatment plan with the patient. The patient was provided an opportunity to ask questions and all were answered. The patient agreed with the plan and demonstrated an understanding of the instructions.  A copy of instructions were sent to the patient via MyChart unless otherwise noted below.   The patient was advised to call back or seek an in-person evaluation if the symptoms worsen or if the condition fails to improve as anticipated.  Time:  I spent 10 minutes with the patient via telehealth technology discussing the above problems/concerns.    Piedad Climes, PA-C

## 2023-04-09 ENCOUNTER — Ambulatory Visit: Payer: 59 | Admitting: Nurse Practitioner

## 2023-04-14 ENCOUNTER — Ambulatory Visit: Payer: 59 | Admitting: Nurse Practitioner

## 2023-04-14 ENCOUNTER — Encounter: Payer: Self-pay | Admitting: Nurse Practitioner

## 2023-04-14 VITALS — BP 121/82 | HR 71 | Temp 97.7°F | Wt 164.0 lb

## 2023-04-14 DIAGNOSIS — E782 Mixed hyperlipidemia: Secondary | ICD-10-CM | POA: Diagnosis not present

## 2023-04-14 DIAGNOSIS — Z23 Encounter for immunization: Secondary | ICD-10-CM

## 2023-04-14 DIAGNOSIS — L4050 Arthropathic psoriasis, unspecified: Secondary | ICD-10-CM

## 2023-04-14 DIAGNOSIS — I77810 Thoracic aortic ectasia: Secondary | ICD-10-CM | POA: Diagnosis not present

## 2023-04-14 MED ORDER — GEMFIBROZIL 600 MG PO TABS: ORAL_TABLET | ORAL | 1 refills | Status: DC

## 2023-04-14 NOTE — Assessment & Plan Note (Signed)
Has followed with Cardiology in past - last Echocardiogram in 2018.  Should follow up with Cardiology.

## 2023-04-14 NOTE — Assessment & Plan Note (Signed)
Chronic.  Controlled.  Continue with current medication regimen of Gemfibrozil.  Refills sent today.  Labs ordered today.  Return to clinic in 6 months for reevaluation.  Call sooner if concerns arise.   

## 2023-04-14 NOTE — Assessment & Plan Note (Signed)
Chronic.  Controlled without medication. Labs ordered today.  Denies referral to specialist at visit today.  Return to clinic in 6 months for reevaluation.  Call sooner if concerns arise.

## 2023-04-14 NOTE — Progress Notes (Signed)
BP 121/82   Pulse 71   Temp 97.7 F (36.5 C) (Oral)   Wt 164 lb (74.4 kg)   SpO2 98%   BMI 27.29 kg/m    Subjective:    Patient ID: Benjamin Boone, male    DOB: 04/28/1956, 67 y.o.   MRN: [id number]  HPI: Benjamin Boone is a 67 y.o. male  Chief Complaint  Patient presents with   Hyperlipidemia   HYPERLIPIDEMIA Hyperlipidemia status: excellent compliance Satisfied with current treatment?  yes Side effects:  no Medication compliance: excellent compliance Past cholesterol meds: gemfibrozil Supplements: none Aspirin:  no The 10-year ASCVD risk score (Arnett DK, et al., 2019) is: 12.9%   Values used to calculate the score:     Age: 89 years     Sex: Male     Is Non-Hispanic African American: No     Diabetic: No     Tobacco smoker: No     Systolic Blood Pressure: 121 mmHg     Is BP treated: No     HDL Cholesterol: 40 mg/dL     Total Cholesterol: 214 mg/dL Chest pain:  no Coronary artery disease:  no Family history CAD:  no Family history early CAD:  no   Denies HA, CP, SOB, dizziness, palpitations, visual changes, and lower extremity swelling.  PSORIATIC ARTHRITIS Feels like he is doing well.  Does not feel like he needs to see specialist regarding psoriatic arthritis at this time.    Relevant past medical, surgical, family and social history reviewed and updated as indicated. Interim medical history since our last visit reviewed. Allergies and medications reviewed and updated.  Review of Systems  Eyes:  Negative for visual disturbance.  Respiratory:  Negative for shortness of breath.   Cardiovascular:  Negative for chest pain and leg swelling.  Neurological:  Negative for light-headedness and headaches.    Per HPI unless specifically indicated above     Objective:    BP 121/82   Pulse 71   Temp 97.7 F (36.5 C) (Oral)   Wt 164 lb (74.4 kg)   SpO2 98%   BMI 27.29 kg/m   Wt Readings from Last 3 Encounters:  04/14/23 164 lb (74.4  kg)  10/09/22 160 lb 14.4 oz (73 kg)  10/08/21 162 lb 9.6 oz (73.8 kg)    Physical Exam Vitals and nursing note reviewed.  Constitutional:      General: He is not in acute distress.    Appearance: Normal appearance. He is not ill-appearing, toxic-appearing or diaphoretic.  HENT:     Head: Normocephalic.     Right Ear: External ear normal.     Left Ear: External ear normal.     Nose: Nose normal. No congestion or rhinorrhea.     Mouth/Throat:     Mouth: Mucous membranes are moist.  Eyes:     General:        Right eye: No discharge.        Left eye: No discharge.     Extraocular Movements: Extraocular movements intact.     Conjunctiva/sclera: Conjunctivae normal.     Pupils: Pupils are equal, round, and reactive to light.  Cardiovascular:     Rate and Rhythm: Normal rate and regular rhythm.     Heart sounds: No murmur heard. Pulmonary:     Effort: Pulmonary effort is normal. No respiratory distress.     Breath sounds: Normal breath sounds. No wheezing, rhonchi or rales.  Abdominal:  General: Abdomen is flat. Bowel sounds are normal.  Musculoskeletal:     Cervical back: Normal range of motion and neck supple.  Skin:    General: Skin is warm and dry.     Capillary Refill: Capillary refill takes less than 2 seconds.  Neurological:     General: No focal deficit present.     Mental Status: He is alert and oriented to person, place, and time.  Psychiatric:        Mood and Affect: Mood normal.        Behavior: Behavior normal.        Thought Content: Thought content normal.        Judgment: Judgment normal.     Results for orders placed or performed in visit on 10/09/22  TSH  Result Value Ref Range   TSH 2.830 0.450 - 4.500 uIU/mL  PSA  Result Value Ref Range   Prostate Specific Ag, Serum 1.1 0.0 - 4.0 ng/mL  Lipid panel  Result Value Ref Range   Cholesterol, Total 214 (H) 100 - 199 mg/dL   Triglycerides 161 0 - 149 mg/dL   HDL 40 >09 mg/dL   VLDL Cholesterol Cal  21 5 - 40 mg/dL   LDL Chol Calc (NIH) 604 (H) 0 - 99 mg/dL   Chol/HDL Ratio 5.4 (H) 0.0 - 5.0 ratio  CBC with Differential/Platelet  Result Value Ref Range   WBC 6.8 3.4 - 10.8 x10E3/uL   RBC 5.31 4.14 - 5.80 x10E6/uL   Hemoglobin 15.0 13.0 - 17.7 g/dL   Hematocrit 54.0 98.1 - 51.0 %   MCV 86 79 - 97 fL   MCH 28.2 26.6 - 33.0 pg   MCHC 33.0 31.5 - 35.7 g/dL   RDW 19.1 47.8 - 29.5 %   Platelets 337 150 - 450 x10E3/uL   Neutrophils 52 Not Estab. %   Lymphs 28 Not Estab. %   Monocytes 10 Not Estab. %   Eos 8 Not Estab. %   Basos 2 Not Estab. %   Neutrophils Absolute 3.6 1.4 - 7.0 x10E3/uL   Lymphocytes Absolute 1.9 0.7 - 3.1 x10E3/uL   Monocytes Absolute 0.7 0.1 - 0.9 x10E3/uL   EOS (ABSOLUTE) 0.6 (H) 0.0 - 0.4 x10E3/uL   Basophils Absolute 0.1 0.0 - 0.2 x10E3/uL   Immature Granulocytes 0 Not Estab. %   Immature Grans (Abs) 0.0 0.0 - 0.1 x10E3/uL  Comprehensive metabolic panel  Result Value Ref Range   Glucose 84 70 - 99 mg/dL   BUN 13 8 - 27 mg/dL   Creatinine, Ser 6.21 0.76 - 1.27 mg/dL   eGFR 73 >30 QM/VHQ/4.69   BUN/Creatinine Ratio 12 10 - 24   Sodium 140 134 - 144 mmol/L   Potassium 4.1 3.5 - 5.2 mmol/L   Chloride 103 96 - 106 mmol/L   CO2 22 20 - 29 mmol/L   Calcium 9.4 8.6 - 10.2 mg/dL   Total Protein 6.7 6.0 - 8.5 g/dL   Albumin 4.3 3.9 - 4.9 g/dL   Globulin, Total 2.4 1.5 - 4.5 g/dL   Albumin/Globulin Ratio 1.8 1.2 - 2.2   Bilirubin Total 0.5 0.0 - 1.2 mg/dL   Alkaline Phosphatase 93 44 - 121 IU/L   AST 18 0 - 40 IU/L   ALT 22 0 - 44 IU/L  Urinalysis, Routine w reflex microscopic  Result Value Ref Range   Specific Gravity, UA 1.025 1.005 - 1.030   pH, UA 5.5 5.0 - 7.5   Color, UA  Yellow Yellow   Appearance Ur Clear Clear   Leukocytes,UA Negative Negative   Protein,UA Negative Negative/Trace   Glucose, UA Negative Negative   Ketones, UA Negative Negative   RBC, UA Negative Negative   Bilirubin, UA Negative Negative   Urobilinogen, Ur 0.2 0.2 - 1.0 mg/dL    Nitrite, UA Negative Negative   Microscopic Examination Comment       Assessment & Plan:   Problem List Items Addressed This Visit       Cardiovascular and Mediastinum   Dilated aortic root (HCC)    Has followed with Cardiology in past - last Echocardiogram in 2018.  Should follow up with Cardiology.       Relevant Medications   gemfibrozil (LOPID) 600 MG tablet   Other Relevant Orders   Comp Met (CMET)     Musculoskeletal and Integument   Psoriatic arthritis (HCC) - Primary    Chronic.  Controlled without medication. Labs ordered today.  Denies referral to specialist at visit today.  Return to clinic in 6 months for reevaluation.  Call sooner if concerns arise.        Relevant Orders   Comp Met (CMET)     Other   Hyperlipidemia    Chronic.  Controlled.  Continue with current medication regimen of Gemfibrozil.  Refills sent today.  Labs ordered today.  Return to clinic in 6 months for reevaluation.  Call sooner if concerns arise.        Relevant Medications   gemfibrozil (LOPID) 600 MG tablet   Other Relevant Orders   Comp Met (CMET)   Lipid Profile   Other Visit Diagnoses     Need for tetanus booster       Relevant Orders   Td : Tetanus/diphtheria >7yo Preservative  free (Completed)        Follow up plan: Return in about 6 months (around 10/15/2023) for Physical and Fasting labs.

## 2023-04-15 LAB — COMPREHENSIVE METABOLIC PANEL
ALT: 21 IU/L (ref 0–44)
AST: 22 IU/L (ref 0–40)
Albumin/Globulin Ratio: 1.6 (ref 1.2–2.2)
Albumin: 4.4 g/dL (ref 3.9–4.9)
Alkaline Phosphatase: 81 IU/L (ref 44–121)
BUN/Creatinine Ratio: 15 (ref 10–24)
BUN: 17 mg/dL (ref 8–27)
Bilirubin Total: 0.4 mg/dL (ref 0.0–1.2)
CO2: 19 mmol/L — ABNORMAL LOW (ref 20–29)
Calcium: 9.7 mg/dL (ref 8.6–10.2)
Chloride: 103 mmol/L (ref 96–106)
Creatinine, Ser: 1.17 mg/dL (ref 0.76–1.27)
Globulin, Total: 2.8 g/dL (ref 1.5–4.5)
Glucose: 85 mg/dL (ref 70–99)
Potassium: 4.4 mmol/L (ref 3.5–5.2)
Sodium: 140 mmol/L (ref 134–144)
Total Protein: 7.2 g/dL (ref 6.0–8.5)
eGFR: 70 mL/min/{1.73_m2} (ref >59)

## 2023-04-15 LAB — LIPID PANEL
Chol/HDL Ratio: 5.6 ratio — ABNORMAL HIGH (ref 0.0–5.0)
Cholesterol, Total: 237 mg/dL — ABNORMAL HIGH (ref 100–199)
HDL: 42 mg/dL (ref >39)
LDL Chol Calc (NIH): 166 mg/dL — ABNORMAL HIGH (ref 0–99)
Triglycerides: 156 mg/dL — ABNORMAL HIGH (ref 0–149)
VLDL Cholesterol Cal: 29 mg/dL (ref 5–40)

## 2023-04-15 NOTE — Progress Notes (Signed)
Please let patient know that his cholesterol is elevated.  his cardiac risk score puts him at high risk of having a stroke or heart attack over the next 10 years.  I recommend that he start a statin called crestor 5mg  daily.  The goal will be to increase this to 20mg  daily if patient tolerates it well.  If he agrees to the medication I can send it to the pharmacy.    Otherwise, lab work looks good.  No other concerns at this time.   The 10-year ASCVD risk score (Arnett DK, et al., 2019) is: 13.5%   Values used to calculate the score:     Age: 50 years     Sex: Male     Is Non-Hispanic African American: No     Diabetic: No     Tobacco smoker: No     Systolic Blood Pressure: 121 mmHg     Is BP treated: No     HDL Cholesterol: 42 mg/dL     Total Cholesterol: 237 mg/dL

## 2023-06-18 ENCOUNTER — Other Ambulatory Visit: Payer: Self-pay | Admitting: Nurse Practitioner

## 2023-06-18 NOTE — Telephone Encounter (Signed)
Medication Refill - Medication: albuterol (VENTOLIN HFA) 108 (90 Base) MCG/ACT inhaler Completely out and needs a refill   Has the patient contacted their pharmacy? Yes.  Advised to contact pcp  Preferred Pharmacy (with phone number or street name): [facility] [phone] Dan [address]re[address], [place] - [address]  Phone: [phone] Fax: [phone]  Has the patient been seen for an appointment in the last year OR does the patient have an upcoming appointment? Yes.  Last seen 04/14/23 and follow up on 10/15/2023

## 2023-06-18 NOTE — Telephone Encounter (Signed)
Requested medication (s) are due for refill today: routing for review  Requested medication (s) are on the active medication list: yes  Last refill:  11/10/19  Future visit scheduled: yes  Notes to clinic:  Unable to refill per protocol, last refill by another provider.      Requested Prescriptions  Pending Prescriptions Disp Refills   albuterol (VENTOLIN HFA) 108 (90 Base) MCG/ACT inhaler 18 g 2     Pulmonology:  Beta Agonists 2 Passed - 06/18/2023 10:23 AM      Passed - Last BP in normal range    BP Readings from Last 1 Encounters:  04/14/23 121/82         Passed - Last Heart Rate in normal range    Pulse Readings from Last 1 Encounters:  04/14/23 71         Passed - Valid encounter within last 12 months    Recent Outpatient Visits           2 months ago Psoriatic arthritis [facility])   [place] [facility] Larae Grooms, NP   8 months ago Annual physical exam   [place] [facility] Larae Grooms, NP   8 months ago Acute non-recurrent frontal sinusitis   [place] [facility] Gabriel Cirri, NP   1 year ago Annual physical exam   [place] [facility] Larae Grooms, NP   1 year ago Acute recurrent sinusitis, unspecified location   [place] [facility] Vigg, Avanti, MD       Future Appointments             In 3 months Larae Grooms, NP [place] [facility]

## 2023-06-21 MED ORDER — ALBUTEROL SULFATE HFA 108 (90 BASE) MCG/ACT IN AERS: 2.0000 | INHALATION_SPRAY | Freq: Four times a day (QID) | RESPIRATORY_TRACT | 2 refills | Status: AC | PRN

## 2023-06-28 ENCOUNTER — Ambulatory Visit (INDEPENDENT_AMBULATORY_CARE_PROVIDER_SITE_OTHER): Payer: Medicare Other | Admitting: Family Medicine

## 2023-06-28 ENCOUNTER — Encounter: Payer: Self-pay | Admitting: Family Medicine

## 2023-06-28 VITALS — BP 117/81 | HR 73 | Temp 98.1°F | Wt 168.0 lb

## 2023-06-28 DIAGNOSIS — J329 Chronic sinusitis, unspecified: Secondary | ICD-10-CM

## 2023-06-28 MED ORDER — AMOXICILLIN-POT CLAVULANATE 875-125 MG PO TABS
1.0000 | ORAL_TABLET | Freq: Two times a day (BID) | ORAL | 0 refills | Status: AC
Start: 2023-06-28 — End: 2023-07-05

## 2023-06-28 NOTE — Assessment & Plan Note (Signed)
Chronic, ongoing. Augmentin BID given for 7 days. Recommend humidifier use at night, hot liquids, and continued use of OTC mucinex.

## 2023-06-28 NOTE — Patient Instructions (Addendum)
Try humidifier at night or hot shower to inhale stem  Hot fluids such as tea or soup/broth to thin mucus

## 2023-06-28 NOTE — Progress Notes (Signed)
BP 117/81   Pulse 73   Temp 98.1 F (36.7 C) (Oral)   Wt 168 lb (76.2 kg)   SpO2 98%   BMI 27.96 kg/m    Subjective:    Patient ID: Benjamin Boone, male    DOB: August 07, 1956, 67 y.o.   MRN: [id number]  HPI: Benjamin Boone is a 67 y.o. male  Chief Complaint  Patient presents with   URI    Pt states he has been having nasal congestion, mainly on the left side, drainage, cough, and pressure behind his eyes for the last 2 to 3 weeks. States he has been taking OTC Mucinex but has not had much relief.    SINUSITIS Has history of recurrent sinusitis. Last occurrence was December 2023.  Duration: 1 month Worst symptom:left sided nasal congestion Fever: no Cough: yes Shortness of breath: no Wheezing: no Chest pain: no Chest tightness: no Chest congestion: no Nasal congestion: yes Runny nose: Yes Post nasal drip: yes Sneezing: yes Sore throat: no Swollen glands: no Sinus pressure: yes Headache: yes Face pain: yes Toothache: no Ear pain: no  Ear pressure: yes bilateral Eyes red/itching:no Eye drainage/crusting: no  Vomiting: no Rash: no Fatigue: No Sick contacts: no Strep contacts: no  Context: worse Recurrent sinusitis: yes Relief with OTC cold/cough medications: no  Treatments attempted: mucinex and anti-histamine   Relevant past medical, surgical, family and social history reviewed and updated as indicated. Interim medical history since our last visit reviewed. Allergies and medications reviewed and updated.  Review of Systems  Constitutional:  Negative for chills, fatigue and fever.  HENT:  Positive for congestion, postnasal drip, rhinorrhea, sinus pressure, sinus pain and sneezing. Negative for ear pain and sore throat.   Eyes:  Negative for discharge, redness and itching.  Respiratory:  Positive for cough. Negative for chest tightness, shortness of breath and wheezing.   Cardiovascular:  Negative for chest pain.  Gastrointestinal:  Negative  for vomiting.  Skin:  Negative for rash.  Neurological:  Negative for headaches.    Per HPI unless specifically indicated above     Objective:    BP 117/81   Pulse 73   Temp 98.1 F (36.7 C) (Oral)   Wt 168 lb (76.2 kg)   SpO2 98%   BMI 27.96 kg/m   Wt Readings from Last 3 Encounters:  06/28/23 168 lb (76.2 kg)  04/14/23 164 lb (74.4 kg)  10/09/22 160 lb 14.4 oz (73 kg)    Physical Exam Vitals and nursing note reviewed.  Constitutional:      General: He is not in acute distress.    Appearance: Normal appearance. He is not ill-appearing, toxic-appearing or diaphoretic.  HENT:     Head: Normocephalic and atraumatic.     Right Ear: Tympanic membrane, ear canal and external ear normal. There is no impacted cerumen.     Left Ear: Tympanic membrane, ear canal and external ear normal. There is no impacted cerumen.     Nose: Congestion and rhinorrhea present.     Right Sinus: Maxillary sinus tenderness and frontal sinus tenderness present.     Left Sinus: Maxillary sinus tenderness and frontal sinus tenderness present.     Mouth/Throat:     Mouth: Mucous membranes are moist.     Pharynx: Oropharynx is clear. Posterior oropharyngeal erythema and postnasal drip present. No oropharyngeal exudate.  Eyes:     General: No scleral icterus.       Right eye: No discharge.  Left eye: No discharge.     Extraocular Movements: Extraocular movements intact.     Conjunctiva/sclera: Conjunctivae normal.     Pupils: Pupils are equal, round, and reactive to light.  Neck:     Vascular: No carotid bruit.  Cardiovascular:     Rate and Rhythm: Normal rate and regular rhythm.     Pulses: Normal pulses.          Radial pulses are 2+ on the right side and 2+ on the left side.       Posterior tibial pulses are 2+ on the right side and 2+ on the left side.     Heart sounds: Normal heart sounds. No murmur heard.    No friction rub. No gallop.  Pulmonary:     Effort: Pulmonary effort is  normal. No respiratory distress.     Breath sounds: Normal breath sounds. No stridor. No wheezing, rhonchi or rales.  Chest:     Chest wall: No tenderness.  Musculoskeletal:        General: Normal range of motion.     Cervical back: Normal range of motion and neck supple. No rigidity. No muscular tenderness.  Lymphadenopathy:     Cervical: No cervical adenopathy.  Skin:    General: Skin is warm and dry.     Capillary Refill: Capillary refill takes less than 2 seconds.     Coloration: Skin is not jaundiced or pale.     Findings: No bruising, erythema, lesion or rash.  Neurological:     General: No focal deficit present.     Mental Status: He is alert and oriented to person, place, and time. Mental status is at baseline.     Cranial Nerves: No cranial nerve deficit.     Sensory: No sensory deficit.     Motor: No weakness.     Coordination: Coordination normal.     Gait: Gait normal.     Deep Tendon Reflexes: Reflexes normal.  Psychiatric:        Mood and Affect: Mood normal.        Behavior: Behavior normal.        Thought Content: Thought content normal.        Judgment: Judgment normal.     Results for orders placed or performed in visit on 04/14/23  Comp Met (CMET)  Result Value Ref Range   Glucose 85 70 - 99 mg/dL   BUN 17 8 - 27 mg/dL   Creatinine, Ser 2.44 0.76 - 1.27 mg/dL   eGFR 70 >01 UU/VOZ/3.66   BUN/Creatinine Ratio 15 10 - 24   Sodium 140 134 - 144 mmol/L   Potassium 4.4 3.5 - 5.2 mmol/L   Chloride 103 96 - 106 mmol/L   CO2 19 (L) 20 - 29 mmol/L   Calcium 9.7 8.6 - 10.2 mg/dL   Total Protein 7.2 6.0 - 8.5 g/dL   Albumin 4.4 3.9 - 4.9 g/dL   Globulin, Total 2.8 1.5 - 4.5 g/dL   Albumin/Globulin Ratio 1.6 1.2 - 2.2   Bilirubin Total 0.4 0.0 - 1.2 mg/dL   Alkaline Phosphatase 81 44 - 121 IU/L   AST 22 0 - 40 IU/L   ALT 21 0 - 44 IU/L  Lipid Profile  Result Value Ref Range   Cholesterol, Total 237 (H) 100 - 199 mg/dL   Triglycerides 440 (H) 0 - 149 mg/dL    HDL 42 >34 mg/dL   VLDL Cholesterol Cal 29 5 - 40 mg/dL  LDL Chol Calc (NIH) 166 (H) 0 - 99 mg/dL   Chol/HDL Ratio 5.6 (H) 0.0 - 5.0 ratio      Assessment & Plan:   Problem List Items Addressed This Visit     Recurrent sinusitis - Primary    Chronic, ongoing. Augmentin BID given for 7 days. Recommend humidifier use at night, hot liquids, and continued use of OTC mucinex.       Relevant Medications   amoxicillin-clavulanate (AUGMENTIN) 875-125 MG tablet     Follow up plan: Return if symptoms worsen or fail to improve.

## 2023-07-19 DIAGNOSIS — J330 Polyp of nasal cavity: Secondary | ICD-10-CM | POA: Diagnosis not present

## 2023-07-19 DIAGNOSIS — J342 Deviated nasal septum: Secondary | ICD-10-CM | POA: Diagnosis not present

## 2023-07-19 DIAGNOSIS — J3489 Other specified disorders of nose and nasal sinuses: Secondary | ICD-10-CM | POA: Diagnosis not present

## 2023-08-23 DIAGNOSIS — J330 Polyp of nasal cavity: Secondary | ICD-10-CM | POA: Diagnosis not present

## 2023-08-23 DIAGNOSIS — J3489 Other specified disorders of nose and nasal sinuses: Secondary | ICD-10-CM | POA: Diagnosis not present

## 2023-09-01 DIAGNOSIS — K080 Exfoliation of teeth due to systemic causes: Secondary | ICD-10-CM | POA: Diagnosis not present

## 2023-10-01 ENCOUNTER — Ambulatory Visit (INDEPENDENT_AMBULATORY_CARE_PROVIDER_SITE_OTHER): Payer: Medicare Other | Admitting: Family Medicine

## 2023-10-01 VITALS — BP 124/82 | HR 74 | Temp 98.0°F | Ht 65.16 in | Wt 168.8 lb

## 2023-10-01 DIAGNOSIS — J329 Chronic sinusitis, unspecified: Secondary | ICD-10-CM

## 2023-10-01 MED ORDER — AMOXICILLIN-POT CLAVULANATE 875-125 MG PO TABS: 1.0000 | ORAL_TABLET | Freq: Two times a day (BID) | ORAL | 0 refills | Status: AC

## 2023-10-01 MED ORDER — BENZONATATE 100 MG PO CAPS: 100.0000 mg | ORAL_CAPSULE | Freq: Two times a day (BID) | ORAL | 0 refills | Status: DC | PRN

## 2023-10-01 NOTE — Progress Notes (Signed)
BP 124/82   Pulse 74   Temp 98 F (36.7 C) (Oral)   Ht 5' 5.16" (1.655 m)   Wt 168 lb 12.8 oz (76.6 kg)   SpO2 98%   BMI 27.95 kg/m    Subjective:    Patient ID: Benjamin Boone, male    DOB: July 22, 1956, 67 y.o.   MRN: [id number]  HPI: Benjamin Boone is a 67 y.o. male  Chief Complaint  Patient presents with   URI   UPPER RESPIRATORY TRACT INFECTION Started one week ago, thought it was just allergies but noticed symptoms started to worsen.  Worst symptom: facial pressure Fever: no Cough: yes sometimes, productive Shortness of breath: no Wheezing: no Chest pain: no Chest tightness: no Chest congestion: No Nasal congestion: yes Runny nose: no Post nasal drip: yes Sneezing: no Sore throat: no Swollen glands: no Sinus pressure: yes Headache: yes Face pain: yes Toothache: no Ear pain: no  Ear pressure: yes "right Eyes red/itching:no Eye drainage/crusting: no  Vomiting: no Rash: no Fatigue: no Sick contacts: no Strep contacts: no  Context: worse Recurrent sinusitis: yes Relief with OTC cold/cough medications: yes Treatments attempted: Nasicort Relevant past medical, surgical, family and social history reviewed and updated as indicated. Interim medical history since our last visit reviewed. Allergies and medications reviewed and updated.  Review of Systems  Constitutional:  Negative for chills, fatigue and fever.  HENT:  Positive for ear pain, postnasal drip, sinus pressure and sinus pain. Negative for congestion, rhinorrhea, sneezing and sore throat.   Eyes:  Negative for discharge, redness and itching.  Respiratory:  Positive for cough. Negative for chest tightness, shortness of breath and wheezing.   Cardiovascular:  Negative for chest pain.  Gastrointestinal:  Negative for vomiting.  Skin:  Negative for rash.  Neurological:  Positive for headaches.   Per HPI unless specifically indicated above     Objective:    BP 124/82   Pulse  74   Temp 98 F (36.7 C) (Oral)   Ht 5' 5.16" (1.655 m)   Wt 168 lb 12.8 oz (76.6 kg)   SpO2 98%   BMI 27.95 kg/m   Wt Readings from Last 3 Encounters:  10/01/23 168 lb 12.8 oz (76.6 kg)  06/28/23 168 lb (76.2 kg)  04/14/23 164 lb (74.4 kg)    Physical Exam Vitals and nursing note reviewed.  Constitutional:      General: He is not in acute distress.    Appearance: Normal appearance. He is ill-appearing. He is not toxic-appearing or diaphoretic.  HENT:     Head: Normocephalic and atraumatic.     Right Ear: Tympanic membrane, ear canal and external ear normal. There is no impacted cerumen. Tympanic membrane is not erythematous.     Left Ear: Tympanic membrane, ear canal and external ear normal. There is no impacted cerumen. Tympanic membrane is not erythematous.     Nose: Nasal tenderness present. No congestion or rhinorrhea.     Right Sinus: Maxillary sinus tenderness and frontal sinus tenderness present.     Left Sinus: Maxillary sinus tenderness and frontal sinus tenderness present.     Mouth/Throat:     Mouth: Mucous membranes are moist.     Pharynx: Oropharynx is clear. No oropharyngeal exudate or posterior oropharyngeal erythema.  Eyes:     General: No scleral icterus.       Right eye: No discharge.        Left eye: No discharge.     Extraocular Movements:  Extraocular movements intact.     Conjunctiva/sclera: Conjunctivae normal.     Pupils: Pupils are equal, round, and reactive to light.  Neck:     Vascular: No carotid bruit.  Cardiovascular:     Rate and Rhythm: Normal rate and regular rhythm.     Pulses: Normal pulses.          Radial pulses are 2+ on the right side and 2+ on the left side.     Heart sounds: Normal heart sounds. No murmur heard.    No friction rub. No gallop.  Pulmonary:     Effort: Pulmonary effort is normal. No respiratory distress.     Breath sounds: Normal breath sounds. No stridor. No wheezing, rhonchi or rales.  Chest:     Chest wall: No  tenderness.  Musculoskeletal:        General: Normal range of motion.     Cervical back: Normal range of motion and neck supple. No rigidity. No muscular tenderness.  Lymphadenopathy:     Cervical: No cervical adenopathy.  Skin:    General: Skin is warm and dry.     Capillary Refill: Capillary refill takes less than 2 seconds.     Coloration: Skin is not jaundiced or pale.     Findings: No bruising, erythema, lesion or rash.  Neurological:     General: No focal deficit present.     Mental Status: He is alert and oriented to person, place, and time. Mental status is at baseline.     Cranial Nerves: No cranial nerve deficit.     Sensory: No sensory deficit.     Motor: No weakness.     Coordination: Coordination normal.     Gait: Gait normal.     Deep Tendon Reflexes: Reflexes normal.  Psychiatric:        Mood and Affect: Mood normal.        Behavior: Behavior normal.        Thought Content: Thought content normal.        Judgment: Judgment normal.     Results for orders placed or performed in visit on 04/14/23  Comp Met (CMET)  Result Value Ref Range   Glucose 85 70 - 99 mg/dL   BUN 17 8 - 27 mg/dL   Creatinine, Ser 8.29 0.76 - 1.27 mg/dL   eGFR 70 >56 OZ/HYQ/6.57   BUN/Creatinine Ratio 15 10 - 24   Sodium 140 134 - 144 mmol/L   Potassium 4.4 3.5 - 5.2 mmol/L   Chloride 103 96 - 106 mmol/L   CO2 19 (L) 20 - 29 mmol/L   Calcium 9.7 8.6 - 10.2 mg/dL   Total Protein 7.2 6.0 - 8.5 g/dL   Albumin 4.4 3.9 - 4.9 g/dL   Globulin, Total 2.8 1.5 - 4.5 g/dL   Albumin/Globulin Ratio 1.6 1.2 - 2.2   Bilirubin Total 0.4 0.0 - 1.2 mg/dL   Alkaline Phosphatase 81 44 - 121 IU/L   AST 22 0 - 40 IU/L   ALT 21 0 - 44 IU/L  Lipid Profile  Result Value Ref Range   Cholesterol, Total 237 (H) 100 - 199 mg/dL   Triglycerides 846 (H) 0 - 149 mg/dL   HDL 42 >96 mg/dL   VLDL Cholesterol Cal 29 5 - 40 mg/dL   LDL Chol Calc (NIH) 295 (H) 0 - 99 mg/dL   Chol/HDL Ratio 5.6 (H) 0.0 - 5.0 ratio       Assessment & Plan:   Problem  List Items Addressed This Visit     Recurrent sinusitis - Primary    Acute, ongoing. Will treat with Augmentin BID for 7 days, tessalon BID PRN for cough. Recommend mucinex daily, plenty of fluids for hydration, and daily allergy medication to help with prevention. Return as needed.       Relevant Medications   amoxicillin-clavulanate (AUGMENTIN) 875-125 MG tablet   benzonatate (TESSALON) 100 MG capsule     Follow up plan: Return if symptoms worsen or fail to improve.

## 2023-10-01 NOTE — Assessment & Plan Note (Signed)
Acute, ongoing. Will treat with Augmentin BID for 7 days, tessalon BID PRN for cough. Recommend mucinex daily, plenty of fluids for hydration, and daily allergy medication to help with prevention. Return as needed.

## 2023-10-01 NOTE — Patient Instructions (Signed)
Try saline nasal spray and mucinex daily  Drink plenty of water for hydration Try daily allergy medicine when feeling better to help with prevention. (Zyrtec, Allegra, Claritin).

## 2023-10-15 ENCOUNTER — Encounter: Payer: 59 | Admitting: Nurse Practitioner

## 2023-10-20 ENCOUNTER — Encounter: Payer: Self-pay | Admitting: Nurse Practitioner

## 2023-10-20 ENCOUNTER — Ambulatory Visit: Payer: Medicare Other | Admitting: Nurse Practitioner

## 2023-10-20 VITALS — BP 128/80 | HR 77 | Temp 97.6°F | Ht 65.0 in | Wt 168.8 lb

## 2023-10-20 DIAGNOSIS — L4050 Arthropathic psoriasis, unspecified: Secondary | ICD-10-CM

## 2023-10-20 DIAGNOSIS — I77810 Thoracic aortic ectasia: Secondary | ICD-10-CM | POA: Diagnosis not present

## 2023-10-20 DIAGNOSIS — E785 Hyperlipidemia, unspecified: Secondary | ICD-10-CM

## 2023-10-20 DIAGNOSIS — Z0000 Encounter for general adult medical examination without abnormal findings: Secondary | ICD-10-CM

## 2023-10-20 DIAGNOSIS — Z7189 Other specified counseling: Secondary | ICD-10-CM | POA: Diagnosis not present

## 2023-10-20 DIAGNOSIS — E782 Mixed hyperlipidemia: Secondary | ICD-10-CM | POA: Diagnosis not present

## 2023-10-20 LAB — URINALYSIS, ROUTINE W REFLEX MICROSCOPIC
Bilirubin, UA: NEGATIVE
Glucose, UA: NEGATIVE
Ketones, UA: NEGATIVE
Leukocytes,UA: NEGATIVE
Nitrite, UA: NEGATIVE
Protein,UA: NEGATIVE
RBC, UA: NEGATIVE
Specific Gravity, UA: 1.015 (ref 1.005–1.030)
Urobilinogen, Ur: 0.2 mg/dL (ref 0.2–1.0)
pH, UA: 5.5 (ref 5.0–7.5)

## 2023-10-20 NOTE — Assessment & Plan Note (Signed)
A voluntary discussion about advance care planning including the explanation and discussion of advance directives was extensively discussed  with the patient for 7 minutes with patient.  Explanation about the health care proxy and Living will was reviewed and packet with forms with explanation of how to fill them out was given.  During this discussion, the patient was able to identify a health care proxy as ** and plans/does not plan to fill out the paperwork required.  Patient was offered a separate Advance Care Planning visit for further assistance with forms.

## 2023-10-20 NOTE — Patient Instructions (Signed)
Preventative Services:  Health Risk Assessment and Personalized Prevention Plan: Up to date Bone Mass Measurements: NA CVD Screening: Up to date Colon Cancer Screening: Up to date Depression Screening: Done today Diabetes Screening: Done today Glaucoma Screening: Up to date Hepatitis B vaccine: NA Hepatitis C screening: Up to date HIV Screening: Up to date Flu Vaccine: Declined Lung cancer Screening: NA Obesity Screening: Up to date Pneumonia Vaccines (2): Declined STI Screening: NA   PSA screening: Done today

## 2023-10-20 NOTE — Assessment & Plan Note (Signed)
Chronic.  Controlled.  Continue with current medication regimen of Gemfibrozil.  Refills sent today.  Labs ordered today.  Return to clinic in 6 months for reevaluation.  Call sooner if concerns arise.

## 2023-10-20 NOTE — Assessment & Plan Note (Signed)
Has not seen Cardiology in several years.  New referral placed for updated ECHO.

## 2023-10-20 NOTE — Assessment & Plan Note (Signed)
Chronic.  Controlled without medication..  Labs ordered today.  Return to clinic in 6 months for reevaluation.  Call sooner if concerns arise.  ° °

## 2023-10-20 NOTE — Progress Notes (Signed)
BP 128/80   Pulse 77   Temp 97.6 F (36.4 C) (Oral)   Ht 5\' 5"  (1.651 m)   Wt 168 lb 12.8 oz (76.6 kg)   SpO2 97%   BMI 28.09 kg/m    Subjective:    Patient ID: Benjamin Boone, male    DOB: 1956/06/12, 67 y.o.   MRN: [id number]  HPI: Benjamin Boone is a 67 y.o. male presenting on 10/20/2023 for comprehensive medical examination. Current medical complaints include: hip pain  He currently lives with: Interim Problems from his last visit: no  HYPERLIPIDEMIA Hyperlipidemia status: excellent compliance Satisfied with current treatment?  yes Side effects:  no Medication compliance: excellent compliance Past cholesterol meds: gemfibrozil Supplements: none Aspirin:  no The 10-year ASCVD risk score (Arnett DK, et al., 2019) is: 15.7%   Values used to calculate the score:     Age: 72 years     Sex: Male     Is Non-Hispanic African American: No     Diabetic: No     Tobacco smoker: No     Systolic Blood Pressure: 128 mmHg     Is BP treated: No     HDL Cholesterol: 42 mg/dL     Total Cholesterol: 237 mg/dL Chest pain:  no Coronary artery disease:  no Family history CAD:  no Family history early CAD:  no  HIP PAIN Duration: months Involved hip: bilateral  Mechanism of injury: unknown Location: diffuse Onset: gradual  Severity: 4/10  Quality: sharp Frequency: intermittent Radiation: no Aggravating factors: walking   Alleviating factors:  sitting down and advil   Status: better Treatments attempted: rest and aleve   Relief with NSAIDs?: mild Weakness with weight bearing: no Weakness with walking: no Paresthesias / decreased sensation: no Swelling: no Redness:no Fevers: no    Functional Status Survey: Is the patient deaf or have difficulty hearing?: No Does the patient have difficulty seeing, even when wearing glasses/contacts?: No Does the patient have difficulty concentrating, remembering, or making decisions?: No Does the patient have  difficulty walking or climbing stairs?: No Does the patient have difficulty dressing or bathing?: No Does the patient have difficulty doing errands alone such as visiting a doctor's office or shopping?: No  FALL RISK:    04/14/2023    9:23 AM 10/09/2022    8:06 AM 10/08/2021    8:16 AM 09/24/2021    1:34 PM 10/07/2020    8:20 AM  Fall Risk   Falls in the past year? 0 0 0 0 0  Number falls in past yr: 0 0 0 0 0  Injury with Fall? 0 0 0 0 0  Risk for fall due to : No Fall Risks No Fall Risks No Fall Risks No Fall Risks No Fall Risks  Follow up  Falls evaluation completed Falls evaluation completed Falls evaluation completed Falls evaluation completed    Depression Screen    10/01/2023    8:44 AM 04/14/2023    9:24 AM 10/09/2022    8:06 AM 10/08/2021    8:16 AM 09/24/2021    1:34 PM  Depression screen PHQ 2/9  Decreased Interest 0 0 0 0 0  Down, Depressed, Hopeless 0 0 0 0 0  PHQ - 2 Score 0 0 0 0 0  Altered sleeping 1 1 0 0 0  Tired, decreased energy 1 0 0 0 0  Change in appetite 1 0 0 0 0  Feeling bad or failure about yourself  0 0 0 0  0  Trouble concentrating 0 0 0 0 0  Moving slowly or fidgety/restless 0 0 0 0 0  Suicidal thoughts 0 0 0 0 0  PHQ-9 Score 3 1 0 0 0  Difficult doing work/chores Somewhat difficult Not difficult at all Not difficult at all Not difficult at all Not difficult at all    Advanced Directives Does patient have a HCPOA?    yes If yes, name and contact information: his wife Does patient have a living will or MOST form?  no  Past Medical History:  Past Medical History:  Diagnosis Date   Allergy    Arthritis    Hyperlipidemia    Venous insufficiency     Surgical History:  Past Surgical History:  Procedure Laterality Date   COLONOSCOPY WITH PROPOFOL N/A 06/02/2021   Procedure: COLONOSCOPY WITH BIOPSY;  Surgeon: Midge Minium, MD;  Location: [facility];  Service: Endoscopy;  Laterality: N/A;   POLYPECTOMY N/A 06/02/2021   Procedure:  POLYPECTOMY;  Surgeon: Midge Minium, MD;  Location: [facility];  Service: Endoscopy;  Laterality: N/A;   TONSILLECTOMY     TONSILLECTOMY      Medications:  Current Outpatient Medications on File Prior to Visit  Medication Sig   albuterol (VENTOLIN HFA) 108 (90 Base) MCG/ACT inhaler Inhale 2 puffs into the lungs every 6 (six) hours as needed for wheezing or shortness of breath.   gemfibrozil (LOPID) 600 MG tablet TAKE 1 TABLET BY MOUTH TWICE A DAY BEFORE A MEAL   Multiple Vitamin (MULTIVITAMIN) tablet Take 1 tablet by mouth daily.   No current facility-administered medications on file prior to visit.    Allergies:  Allergies  Allergen Reactions   Doxycycline Rash    Social History:  Social History   Socioeconomic History   Marital status: Married    Spouse name: Not on file   Number of children: Not on file   Years of education: Not on file   Highest education level: Not on file  Occupational History   Not on file  Tobacco Use   Smoking status: Never   Smokeless tobacco: Never  Vaping Use   Vaping status: Never Used  Substance and Sexual Activity   Alcohol use: No   Drug use: No   Sexual activity: Yes  Other Topics Concern   Not on file  Social History Narrative   Not on file   Social Determinants of Health   Financial Resource Strain: Not on file  Food Insecurity: Not on file  Transportation Needs: Not on file  Physical Activity: Not on file  Stress: Not on file  Social Connections: Not on file  Intimate Partner Violence: Not on file   Social History   Tobacco Use  Smoking Status Never  Smokeless Tobacco Never   Social History   Substance and Sexual Activity  Alcohol Use No    Family History:  Family History  Problem Relation Age of Onset   Heart disease Father        MI   Heart disease Mother        pacemaker   Heart attack Maternal Uncle    Prostate cancer Paternal Uncle     Past medical history, surgical history, medications,  allergies, family history and social history reviewed with patient today and changes made to appropriate areas of the chart.   Review of Systems  Eyes:  Negative for blurred vision and double vision.  Respiratory:  Negative for shortness of breath.   Cardiovascular:  Negative for chest pain, palpitations and leg swelling.  Musculoskeletal:        Bilateral hip pain  Neurological:  Negative for dizziness and headaches.   All other ROS negative except what is listed above and in the HPI.      Objective:    BP 128/80   Pulse 77   Temp 97.6 F (36.4 C) (Oral)   Ht 5\' 5"  (1.651 m)   Wt 168 lb 12.8 oz (76.6 kg)   SpO2 97%   BMI 28.09 kg/m   Wt Readings from Last 3 Encounters:  10/20/23 168 lb 12.8 oz (76.6 kg)  10/01/23 168 lb 12.8 oz (76.6 kg)  06/28/23 168 lb (76.2 kg)    Hearing Screening   500Hz  1000Hz  2000Hz  4000Hz   Right ear 20 20 20 20   Left ear 20 20 20 20    Vision Screening   Right eye Left eye Both eyes  Without correction 20/40 20/13 20/25   With correction       Physical Exam Vitals and nursing note reviewed.  Constitutional:      General: He is not in acute distress.    Appearance: Normal appearance. He is not ill-appearing, toxic-appearing or diaphoretic.  HENT:     Head: Normocephalic.     Right Ear: Tympanic membrane, ear canal and external ear normal.     Left Ear: Tympanic membrane, ear canal and external ear normal.     Nose: Nose normal. No congestion or rhinorrhea.     Mouth/Throat:     Mouth: Mucous membranes are moist.  Eyes:     General:        Right eye: No discharge.        Left eye: No discharge.     Extraocular Movements: Extraocular movements intact.     Conjunctiva/sclera: Conjunctivae normal.     Pupils: Pupils are equal, round, and reactive to light.  Cardiovascular:     Rate and Rhythm: Normal rate and regular rhythm.     Heart sounds: No murmur heard. Pulmonary:     Effort: Pulmonary effort is normal. No respiratory distress.      Breath sounds: Normal breath sounds. No wheezing, rhonchi or rales.  Abdominal:     General: Abdomen is flat. Bowel sounds are normal. There is no distension.     Palpations: Abdomen is soft.     Tenderness: There is no abdominal tenderness. There is no guarding.  Musculoskeletal:     Cervical back: Normal range of motion and neck supple.  Skin:    General: Skin is warm and dry.     Capillary Refill: Capillary refill takes less than 2 seconds.  Neurological:     General: No focal deficit present.     Mental Status: He is alert and oriented to person, place, and time.     Cranial Nerves: No cranial nerve deficit.     Motor: No weakness.     Deep Tendon Reflexes: Reflexes normal.  Psychiatric:        Mood and Affect: Mood normal.        Behavior: Behavior normal.        Thought Content: Thought content normal.        Judgment: Judgment normal.        10/20/2023    1:14 PM  6CIT Screen  What Year? 0 points  What month? 0 points  What time? 0 points  Count back from 20 0 points  Months in reverse 0 points  Repeat phrase 0 points  Total Score 0 points    Results for orders placed or performed in visit on 04/14/23  Comp Met (CMET)  Result Value Ref Range   Glucose 85 70 - 99 mg/dL   BUN 17 8 - 27 mg/dL   Creatinine, Ser 1.61 0.76 - 1.27 mg/dL   eGFR 70 >09 UE/AVW/0.98   BUN/Creatinine Ratio 15 10 - 24   Sodium 140 134 - 144 mmol/L   Potassium 4.4 3.5 - 5.2 mmol/L   Chloride 103 96 - 106 mmol/L   CO2 19 (L) 20 - 29 mmol/L   Calcium 9.7 8.6 - 10.2 mg/dL   Total Protein 7.2 6.0 - 8.5 g/dL   Albumin 4.4 3.9 - 4.9 g/dL   Globulin, Total 2.8 1.5 - 4.5 g/dL   Albumin/Globulin Ratio 1.6 1.2 - 2.2   Bilirubin Total 0.4 0.0 - 1.2 mg/dL   Alkaline Phosphatase 81 44 - 121 IU/L   AST 22 0 - 40 IU/L   ALT 21 0 - 44 IU/L  Lipid Profile  Result Value Ref Range   Cholesterol, Total 237 (H) 100 - 199 mg/dL   Triglycerides 119 (H) 0 - 149 mg/dL   HDL 42 >14 mg/dL   VLDL  Cholesterol Cal 29 5 - 40 mg/dL   LDL Chol Calc (NIH) 782 (H) 0 - 99 mg/dL   Chol/HDL Ratio 5.6 (H) 0.0 - 5.0 ratio      Assessment & Plan:   Problem List Items Addressed This Visit       Cardiovascular and Mediastinum   Dilated aortic root (HCC)    Has not seen Cardiology in several years.  New referral placed for updated ECHO.      Relevant Orders   Ambulatory referral to Cardiology     Musculoskeletal and Integument   Psoriatic arthritis (HCC)    Chronic.  Controlled without medication. Labs ordered today.  Return to clinic in 6 months for reevaluation.  Call sooner if concerns arise.         Other   Hyperlipidemia    Chronic.  Controlled.  Continue with current medication regimen of Gemfibrozil.  Refills sent today.  Labs ordered today.  Return to clinic in 6 months for reevaluation.  Call sooner if concerns arise.       Relevant Orders   Lipid panel   Advanced care planning/counseling discussion    A voluntary discussion about advance care planning including the explanation and discussion of advance directives was extensively discussed  with the patient for 7 minutes with patient.  Explanation about the health care proxy and Living will was reviewed and packet with forms with explanation of how to fill them out was given.  During this discussion, the patient was able to identify a health care proxy as ** and plans/does not plan to fill out the paperwork required.  Patient was offered a separate Advance Care Planning visit for further assistance with forms.         Other Visit Diagnoses     Encounter for annual wellness exam in Medicare patient    -  Primary   Relevant Orders   DG Hip Unilat W OR W/O Pelvis 2-3 Views Left   DG Hip Unilat W OR W/O Pelvis 2-3 Views Right   Annual physical exam       Health maintenance reviewed during visit today.  Labs ordered. Vaccines discussed during visit.  Colon cancer screening up to date.   Relevant Orders  EKG 12-Lead  (Completed)   TSH   PSA   Lipid panel   CBC with Differential/Platelet   Comprehensive metabolic panel   Urinalysis, Routine w reflex microscopic        Preventative Services:  Health Risk Assessment and Personalized Prevention Plan: Up to date Bone Mass Measurements: NA CVD Screening: Up to date Colon Cancer Screening: Up to date Depression Screening: Done today Diabetes Screening: Done today Glaucoma Screening: Up to date Hepatitis B vaccine: NA Hepatitis C screening: Up to date HIV Screening: Up to date Flu Vaccine: Declined Lung cancer Screening: NA Obesity Screening: Up to date Pneumonia Vaccines (2): Declined STI Screening: NA   PSA screening: Done today  Discussed aspirin prophylaxis for myocardial infarction prevention and decision was it was not indicated  LABORATORY TESTING:  Health maintenance labs ordered today as discussed above.   The natural history of prostate cancer and ongoing controversy regarding screening and potential treatment outcomes of prostate cancer has been discussed with the patient. The meaning of a false positive PSA and a false negative PSA has been discussed. He indicates understanding of the limitations of this screening test and wishes to proceed with screening PSA testing.   IMMUNIZATIONS:   - Tdap: Tetanus vaccination status reviewed: last tetanus booster within 10 years. - Influenza: Refused - Pneumovax: Refused - Prevnar: Refused - Zostavax vaccine: Not applicable  SCREENING: - Colonoscopy: Ordered today  Discussed with patient purpose of the colonoscopy is to detect colon cancer at curable precancerous or early stages   - AAA Screening: Not applicable  -Hearing Test: Not applicable  -Spirometry: Not applicable   PATIENT COUNSELING:    Sexuality: Discussed sexually transmitted diseases, partner selection, use of condoms, avoidance of unintended pregnancy  and contraceptive alternatives.   Advised to avoid cigarette  smoking.  I discussed with the patient that most people either abstain from alcohol or drink within safe limits (<=14/week and <=4 drinks/occasion for males, <=7/weeks and <= 3 drinks/occasion for females) and that the risk for alcohol disorders and other health effects rises proportionally with the number of drinks per week and how often a drinker exceeds daily limits.  Discussed cessation/primary prevention of drug use and availability of treatment for abuse.   Diet: Encouraged to adjust caloric intake to maintain  or achieve ideal body weight, to reduce intake of dietary saturated fat and total fat, to limit sodium intake by avoiding high sodium foods and not adding table salt, and to maintain adequate dietary potassium and calcium preferably from fresh fruits, vegetables, and low-fat dairy products.    stressed the importance of regular exercise  Injury prevention: Discussed safety belts, safety helmets, smoke detector, smoking near bedding or upholstery.   Dental health: Discussed importance of regular tooth brushing, flossing, and dental visits.   Follow up plan: NEXT PREVENTATIVE PHYSICAL DUE IN 1 YEAR. Return in about 6 months (around 04/18/2024) for HTN, HLD, DM2 FU.

## 2023-10-21 LAB — CBC WITH DIFFERENTIAL/PLATELET
Basophils Absolute: 0.1 10*3/uL (ref 0.0–0.2)
Basos: 2 %
EOS (ABSOLUTE): 0.8 10*3/uL — ABNORMAL HIGH (ref 0.0–0.4)
Eos: 13 %
Hematocrit: 43.2 % (ref 37.5–51.0)
Hemoglobin: 15.1 g/dL (ref 13.0–17.7)
Immature Grans (Abs): 0 10*3/uL (ref 0.0–0.1)
Immature Granulocytes: 0 %
Lymphocytes Absolute: 2.1 10*3/uL (ref 0.7–3.1)
Lymphs: 35 %
MCH: 29.8 pg (ref 26.6–33.0)
MCHC: 35 g/dL (ref 31.5–35.7)
MCV: 85 fL (ref 79–97)
Monocytes Absolute: 0.6 10*3/uL (ref 0.1–0.9)
Monocytes: 9 %
Neutrophils Absolute: 2.5 10*3/uL (ref 1.4–7.0)
Neutrophils: 41 %
Platelets: 304 10*3/uL (ref 150–450)
RBC: 5.07 x10E6/uL (ref 4.14–5.80)
RDW: 13.3 % (ref 11.6–15.4)
WBC: 6 10*3/uL (ref 3.4–10.8)

## 2023-10-21 LAB — COMPREHENSIVE METABOLIC PANEL
ALT: 23 [IU]/L (ref 0–44)
AST: 25 [IU]/L (ref 0–40)
Albumin: 4.5 g/dL (ref 3.9–4.9)
Alkaline Phosphatase: 69 [IU]/L (ref 44–121)
BUN/Creatinine Ratio: 13 (ref 10–24)
BUN: 16 mg/dL (ref 8–27)
Bilirubin Total: 0.5 mg/dL (ref 0.0–1.2)
CO2: 21 mmol/L (ref 20–29)
Calcium: 9.7 mg/dL (ref 8.6–10.2)
Chloride: 101 mmol/L (ref 96–106)
Creatinine, Ser: 1.24 mg/dL (ref 0.76–1.27)
Globulin, Total: 2.2 g/dL (ref 1.5–4.5)
Glucose: 83 mg/dL (ref 70–99)
Potassium: 4.2 mmol/L (ref 3.5–5.2)
Sodium: 141 mmol/L (ref 134–144)
Total Protein: 6.7 g/dL (ref 6.0–8.5)
eGFR: 65 mL/min/{1.73_m2} (ref >59)

## 2023-10-21 LAB — LIPID PANEL
Chol/HDL Ratio: 5.4 {ratio} — ABNORMAL HIGH (ref 0.0–5.0)
Cholesterol, Total: 194 mg/dL (ref 100–199)
HDL: 36 mg/dL — ABNORMAL LOW (ref >39)
LDL Chol Calc (NIH): 123 mg/dL — ABNORMAL HIGH (ref 0–99)
Triglycerides: 196 mg/dL — ABNORMAL HIGH (ref 0–149)
VLDL Cholesterol Cal: 35 mg/dL (ref 5–40)

## 2023-10-21 LAB — PSA: Prostate Specific Ag, Serum: 0.9 ng/mL (ref 0.0–4.0)

## 2023-10-21 LAB — TSH: TSH: 2.52 u[IU]/mL (ref 0.450–4.500)

## 2023-10-26 ENCOUNTER — Ambulatory Visit
Admission: RE | Admit: 2023-10-26 | Discharge: 2023-10-26 | Disposition: A | Discharge status: Home / Self Care | Payer: Medicare Other | Source: Ambulatory Visit | Attending: Nurse Practitioner | Admitting: Nurse Practitioner

## 2023-10-26 ENCOUNTER — Ambulatory Visit
Admission: RE | Admit: 2023-10-26 | Discharge: 2023-10-26 | Disposition: A | Discharge status: Home / Self Care | Payer: Medicare Other | Attending: Nurse Practitioner | Admitting: Nurse Practitioner

## 2023-10-26 DIAGNOSIS — M25551 Pain in right hip: Secondary | ICD-10-CM | POA: Diagnosis not present

## 2023-10-26 DIAGNOSIS — Z0000 Encounter for general adult medical examination without abnormal findings: Secondary | ICD-10-CM | POA: Diagnosis not present

## 2023-10-26 DIAGNOSIS — M25552 Pain in left hip: Secondary | ICD-10-CM | POA: Diagnosis not present

## 2023-10-26 DIAGNOSIS — M160 Bilateral primary osteoarthritis of hip: Secondary | ICD-10-CM | POA: Diagnosis not present

## 2023-11-18 DIAGNOSIS — M5136 Other intervertebral disc degeneration, lumbar region with discogenic back pain only: Secondary | ICD-10-CM | POA: Diagnosis not present

## 2023-11-18 DIAGNOSIS — M5416 Radiculopathy, lumbar region: Secondary | ICD-10-CM | POA: Diagnosis not present

## 2023-12-09 DIAGNOSIS — M5416 Radiculopathy, lumbar region: Secondary | ICD-10-CM | POA: Diagnosis not present

## 2023-12-31 DIAGNOSIS — M5416 Radiculopathy, lumbar region: Secondary | ICD-10-CM | POA: Diagnosis not present

## 2024-01-04 ENCOUNTER — Encounter: Payer: Self-pay | Admitting: Cardiology

## 2024-01-04 ENCOUNTER — Ambulatory Visit: Payer: Medicare Other | Attending: Cardiology | Admitting: Cardiology

## 2024-01-04 VITALS — BP 122/80 | HR 78 | Ht 66.0 in | Wt 169.4 lb

## 2024-01-04 DIAGNOSIS — I77810 Thoracic aortic ectasia: Secondary | ICD-10-CM

## 2024-01-04 DIAGNOSIS — E782 Mixed hyperlipidemia: Secondary | ICD-10-CM

## 2024-01-04 MED ORDER — ATORVASTATIN CALCIUM 40 MG PO TABS: 40.0000 mg | ORAL_TABLET | Freq: Every day | ORAL | 3 refills | Status: AC

## 2024-01-04 NOTE — Progress Notes (Signed)
Cardiology Office Note:    Date:  01/04/2024   ID:  Benjamin Boone, DOB May 02, 1956, MRN [id number]  PCP:  Larae Grooms, NP   [facility] Providers Cardiologist:  Debbe Odea, MD     Referring MD: Larae Grooms, NP   Chief Complaint  Patient presents with   New Patient (Initial Visit)    Referred for cardiac evaluation of Dilated aortic root last seen by Dr. Mariah Milling in 2018.      History of Present Illness:    Benjamin Boone is a 68 y.o. male with a hx of hyperlipidemia, mildly dilated aortic root who presents to reestablish care.  He was last seen in the practice back in 2018 due to dilated aortic root.  He denies chest pain or shortness of breath.  Has been on gemfibrozil for hyperlipidemia over the past 1 to 2 years.  Father had a heart attack in his 70s.  Father was also a smoker.    Coronary calcium score 06/2017 was 0. Echo 2018 showed normal EF 55 to 60%, mild aortic root dilatation 3.9 cm. Lexiscan Myoview 2018 showed no significant ischemia.  Low risk study.   Past Medical History:  Diagnosis Date   Allergy    Arthritis    Dilated aortic root (HCC)    Hyperlipidemia    Venous insufficiency     Past Surgical History:  Procedure Laterality Date   COLONOSCOPY WITH PROPOFOL N/A 06/02/2021   Procedure: COLONOSCOPY WITH BIOPSY;  Surgeon: Midge Minium, MD;  Location: [facility];  Service: Endoscopy;  Laterality: N/A;   POLYPECTOMY N/A 06/02/2021   Procedure: POLYPECTOMY;  Surgeon: Midge Minium, MD;  Location: [facility];  Service: Endoscopy;  Laterality: N/A;   TONSILLECTOMY     TONSILLECTOMY      Current Medications: Current Meds  Medication Sig   albuterol (VENTOLIN HFA) 108 (90 Base) MCG/ACT inhaler Inhale 2 puffs into the lungs every 6 (six) hours as needed for wheezing or shortness of breath.   atorvastatin (LIPITOR) 40 MG tablet Take 1 tablet (40 mg total) by mouth daily.   gemfibrozil (LOPID) 600 MG  tablet TAKE 1 TABLET BY MOUTH TWICE A DAY BEFORE A MEAL   Multiple Vitamin (MULTIVITAMIN) tablet Take 1 tablet by mouth daily.     Allergies:   Doxycycline   Social History   Socioeconomic History   Marital status: Married    Spouse name: Not on file   Number of children: Not on file   Years of education: Not on file   Highest education level: Not on file  Occupational History   Not on file  Tobacco Use   Smoking status: Never   Smokeless tobacco: Never  Vaping Use   Vaping status: Never Used  Substance and Sexual Activity   Alcohol use: No   Drug use: No   Sexual activity: Yes  Other Topics Concern   Not on file  Social History Narrative   Not on file   Social Drivers of Health   Financial Resource Strain: Not on file  Food Insecurity: Not on file  Transportation Needs: Not on file  Physical Activity: Not on file  Stress: Not on file  Social Connections: Not on file     Family History: The patient's family history includes Heart attack in his maternal uncle; Heart disease in his father and mother; Prostate cancer in his paternal uncle.  ROS:   Please see the history of present illness.  All other systems reviewed and are negative.  EKGs/Labs/Other Studies Reviewed:    The following studies were reviewed today:  EKG Interpretation Date/Time:  Tuesday January 04 2024 09:21:42 EST Ventricular Rate:  78 PR Interval:  148 QRS Duration:  82 QT Interval:  350 QTC Calculation: 399 R Axis:   -18  Text Interpretation: Normal sinus rhythm Nonspecific T wave abnormality Confirmed by Debbe Odea ([id number]) on 01/04/2024 9:35:53 AM    Recent Labs: 10/20/2023: ALT 23; BUN 16; Creatinine, Ser 1.24; Hemoglobin 15.1; Platelets 304; Potassium 4.2; Sodium 141; TSH 2.520  Recent Lipid Panel    Component Value Date/Time   CHOL 194 10/20/2023 1349   TRIG 196 (H) 10/20/2023 1349   HDL 36 (L) 10/20/2023 1349   CHOLHDL 5.4 (H) 10/20/2023 1349   LDLCALC 123 (H)  10/20/2023 1349     Risk Assessment/Calculations:             Physical Exam:    VS:  BP 122/80 (BP Location: Left Arm, Patient Position: Sitting, Cuff Size: Normal)   Pulse 78   Ht 5\' 6"  (1.676 m)   Wt 169 lb 6.4 oz (76.8 kg)   SpO2 97%   BMI 27.34 kg/m     Wt Readings from Last 3 Encounters:  01/04/24 169 lb 6.4 oz (76.8 kg)  10/20/23 168 lb 12.8 oz (76.6 kg)  10/01/23 168 lb 12.8 oz (76.6 kg)     GEN:  Well nourished, well developed in no acute distress HEENT: Normal NECK: No JVD; No carotid bruits CARDIAC: RRR, no murmurs, rubs, gallops RESPIRATORY:  Clear to auscultation without rales, wheezing or rhonchi  ABDOMEN: Soft, non-tender, non-distended MUSCULOSKELETAL:  No edema; No deformity  SKIN: Warm and dry NEUROLOGIC:  Alert and oriented x 3 PSYCHIATRIC:  Normal affect   ASSESSMENT:    1. Dilated aortic root (HCC)   2. Mixed hyperlipidemia    PLAN:    In order of problems listed above:  Mildly dilated aortic root 3.9 cm in 2018.  Repeat echocardiogram to evaluate aortic root size. Hyperlipidemia, triglycerides and LDL still elevated.  Start Lipitor 40 mg daily, continue gemfibrozil.  Plan to obtain repeat lipid panel in about 3 to 4 months  Follow-up after echo.      Medication Adjustments/Labs and Tests Ordered: Current medicines are reviewed at length with the patient today.  Concerns regarding medicines are outlined above.  Orders Placed This Encounter  Procedures   EKG 12-Lead   ECHOCARDIOGRAM COMPLETE   Meds ordered this encounter  Medications   atorvastatin (LIPITOR) 40 MG tablet    Sig: Take 1 tablet (40 mg total) by mouth daily.    Dispense:  90 tablet    Refill:  3    Patient Instructions  Medication Instructions:   START Atorvastatin - Take one tablet ( 40mg ) by mouth daily.   *If you need a refill on your cardiac medications before your next appointment, please call your pharmacy*   Lab Work:  None Ordered  If you have labs  (blood work) drawn today and your tests are completely normal, you will receive your results only by: MyChart Message (if you have MyChart) OR A paper copy in the mail If you have any lab test that is abnormal or we need to change your treatment, we will call you to review the results.   Testing/Procedures:  Your physician has requested that you have an echocardiogram. Echocardiography is a painless test that uses sound waves to create images of  your heart. It provides your doctor with information about the size and shape of your heart and how well your heart's chambers and valves are working. This procedure takes approximately one hour. There are no restrictions for this procedure. Please do NOT wear cologne, perfume, aftershave, or lotions (deodorant is allowed). Please arrive 15 minutes prior to your appointment time.  Please note: We ask at that you not bring children with you during ultrasound (echo/ vascular) testing. Due to room size and safety concerns, children are not allowed in the ultrasound rooms during exams. Our front office staff cannot provide observation of children in our lobby area while testing is being conducted. An adult accompanying a patient to their appointment will only be allowed in the ultrasound room at the discretion of the ultrasound technician under special circumstances. We apologize for any inconvenience.    Follow-Up: At [facility], you and your health needs are our priority.  As part of our continuing mission to provide you with exceptional heart care, we have created designated Provider Care Teams.  These Care Teams include your primary Cardiologist (physician) and Advanced Practice Providers (APPs -  Physician Assistants and Nurse Practitioners) who all work together to provide you with the care you need, when you need it.  We recommend signing up for the patient portal called "MyChart".  Sign up information is provided on this After Visit Summary.   MyChart is used to connect with patients for Virtual Visits (Telemedicine).  Patients are able to view lab/test results, encounter notes, upcoming appointments, etc.  Non-urgent messages can be sent to your provider as well.   To learn more about what you can do with MyChart, go to [email or web address].    Your next appointment:    After Echocardiogram   Provider:   You may see Debbe Odea, MD or one of the following Advanced Practice Providers on your designated Care Team:   Nicolasa Ducking, NP Eula Listen, PA-C Cadence Fransico Michael, PA-C Charlsie Quest, NP Carlos Levering, NP   Signed, Debbe Odea, MD  01/04/2024 10:14 AM    [facility]

## 2024-01-04 NOTE — Patient Instructions (Signed)
Medication Instructions:   START Atorvastatin - Take one tablet ( 40mg ) by mouth daily.   *If you need a refill on your cardiac medications before your next appointment, please call your pharmacy*   Lab Work:  None Ordered  If you have labs (blood work) drawn today and your tests are completely normal, you will receive your results only by: MyChart Message (if you have MyChart) OR A paper copy in the mail If you have any lab test that is abnormal or we need to change your treatment, we will call you to review the results.   Testing/Procedures:  Your physician has requested that you have an echocardiogram. Echocardiography is a painless test that uses sound waves to create images of your heart. It provides your doctor with information about the size and shape of your heart and how well your heart's chambers and valves are working. This procedure takes approximately one hour. There are no restrictions for this procedure. Please do NOT wear cologne, perfume, aftershave, or lotions (deodorant is allowed). Please arrive 15 minutes prior to your appointment time.  Please note: We ask at that you not bring children with you during ultrasound (echo/ vascular) testing. Due to room size and safety concerns, children are not allowed in the ultrasound rooms during exams. Our front office staff cannot provide observation of children in our lobby area while testing is being conducted. An adult accompanying a patient to their appointment will only be allowed in the ultrasound room at the discretion of the ultrasound technician under special circumstances. We apologize for any inconvenience.    Follow-Up: At [facility], you and your health needs are our priority.  As part of our continuing mission to provide you with exceptional heart care, we have created designated Provider Care Teams.  These Care Teams include your primary Cardiologist (physician) and Advanced Practice Providers (APPs -   Physician Assistants and Nurse Practitioners) who all work together to provide you with the care you need, when you need it.  We recommend signing up for the patient portal called "MyChart".  Sign up information is provided on this After Visit Summary.  MyChart is used to connect with patients for Virtual Visits (Telemedicine).  Patients are able to view lab/test results, encounter notes, upcoming appointments, etc.  Non-urgent messages can be sent to your provider as well.   To learn more about what you can do with MyChart, go to [email or web address].    Your next appointment:    After Echocardiogram   Provider:   You may see Debbe Odea, MD or one of the following Advanced Practice Providers on your designated Care Team:   Nicolasa Ducking, NP Eula Listen, PA-C Cadence Fransico Michael, PA-C Charlsie Quest, NP Carlos Levering, NP

## 2024-01-15 ENCOUNTER — Encounter: Payer: Self-pay | Admitting: Emergency Medicine

## 2024-01-15 ENCOUNTER — Ambulatory Visit
Admission: EM | Admit: 2024-01-15 | Discharge: 2024-01-15 | Disposition: A | Discharge status: Home / Self Care | Payer: Medicare Other | Attending: Emergency Medicine | Admitting: Emergency Medicine

## 2024-01-15 DIAGNOSIS — J069 Acute upper respiratory infection, unspecified: Secondary | ICD-10-CM | POA: Diagnosis not present

## 2024-01-15 MED ORDER — BENZONATATE 100 MG PO CAPS: 200.0000 mg | ORAL_CAPSULE | Freq: Three times a day (TID) | ORAL | 0 refills | Status: DC

## 2024-01-15 MED ORDER — AMOXICILLIN-POT CLAVULANATE 875-125 MG PO TABS: 1.0000 | ORAL_TABLET | Freq: Two times a day (BID) | ORAL | 0 refills | Status: AC

## 2024-01-15 MED ORDER — IPRATROPIUM BROMIDE 0.06 % NA SOLN: 2.0000 | Freq: Four times a day (QID) | NASAL | 12 refills | Status: AC

## 2024-01-15 MED ORDER — PROMETHAZINE-DM 6.25-15 MG/5ML PO SYRP: 5.0000 mL | ORAL_SOLUTION | Freq: Four times a day (QID) | ORAL | 0 refills | Status: DC | PRN

## 2024-01-15 NOTE — Discharge Instructions (Addendum)
 Take the Augmentin 875 mg twice daily with food for 7 days for treatment of your upper respiratory infection.  Use over-the-counter Tylenol and/or ibuprofen according the package instructions as needed for any fever or pain.  Use the Atrovent nasal spray, 2 squirts in each nostril every 6 hours, as needed for runny nose and postnasal drip.  Use the Tessalon Perles every 8 hours during the day.  Take them with a small sip of water.  They may give you some numbness to the base of your tongue or a metallic taste in your mouth, this is normal.  Use the Promethazine DM cough syrup at bedtime for cough and congestion.  It will make you drowsy so do not take it during the day.  Return for reevaluation or see your primary care provider for any new or worsening symptoms.

## 2024-01-15 NOTE — ED Triage Notes (Signed)
 Patient c/o sinus drainage, headaches, sinus pressure behind his eyes and face for over a week.  Patient denies fevers.

## 2024-01-15 NOTE — ED Provider Notes (Signed)
 [facility]    CSN: [id number] Arrival date & time: 01/15/24  4098      History   Chief Complaint Chief Complaint  Patient presents with   Sinus Problem   Headache    HPI Benjamin Boone is a 68 y.o. male.   HPI  68 year old male with past medical history significant for hyperlipidemia, dilated aortic root, venous insufficiency, arthritis, and allergies presents for evaluation of respiratory symptoms that started over a week ago which include fever with a Tmax of 101.1, headache, sinus pressure with thick yellow nasal discharge, ear pain, and a cough that is productive for clear sputum.  He denies any shortness of breath or wheezing.  Past Medical History:  Diagnosis Date   Allergy    Arthritis    Dilated aortic root (HCC)    Hyperlipidemia    Venous insufficiency     Patient Active Problem List   Diagnosis Date Noted   Advanced care planning/counseling discussion 10/20/2023   Rectal polyp    Sleep disturbance 10/07/2020   Dilated aortic root (HCC) 02/20/2017   Recurrent sinusitis 01/06/2017   Psoriatic arthritis (HCC) 10/18/2015   Allergic rhinitis 10/18/2015   Hyperlipidemia 10/18/2015   Venous insufficiency 10/18/2015   Asthma 10/18/2015    Past Surgical History:  Procedure Laterality Date   COLONOSCOPY WITH PROPOFOL N/A 06/02/2021   Procedure: COLONOSCOPY WITH BIOPSY;  Surgeon: Midge Minium, MD;  Location: [facility];  Service: Endoscopy;  Laterality: N/A;   POLYPECTOMY N/A 06/02/2021   Procedure: POLYPECTOMY;  Surgeon: Midge Minium, MD;  Location: [facility];  Service: Endoscopy;  Laterality: N/A;   TONSILLECTOMY     TONSILLECTOMY         Home Medications    Prior to Admission medications   Medication Sig Start Date End Date Taking? Authorizing Provider  amoxicillin-clavulanate (AUGMENTIN) 875-125 MG tablet Take 1 tablet by mouth every 12 (twelve) hours for 7 days. 01/15/24 01/22/24 Yes Becky Augusta, NP   benzonatate (TESSALON) 100 MG capsule Take 2 capsules (200 mg total) by mouth every 8 (eight) hours. 01/15/24  Yes Becky Augusta, NP  ipratropium (ATROVENT) 0.06 % nasal spray Place 2 sprays into both nostrils 4 (four) times daily. 01/15/24  Yes Becky Augusta, NP  promethazine-dextromethorphan (PROMETHAZINE-DM) 6.25-15 MG/5ML syrup Take 5 mLs by mouth 4 (four) times daily as needed. 01/15/24  Yes Becky Augusta, NP  albuterol (VENTOLIN HFA) 108 (90 Base) MCG/ACT inhaler Inhale 2 puffs into the lungs every 6 (six) hours as needed for wheezing or shortness of breath. 06/21/23   Mecum, Erin E, PA-C  atorvastatin (LIPITOR) 40 MG tablet Take 1 tablet (40 mg total) by mouth daily. 01/04/24 04/03/24  Debbe Odea, MD  gemfibrozil (LOPID) 600 MG tablet TAKE 1 TABLET BY MOUTH TWICE A DAY BEFORE A MEAL 04/14/23   Larae Grooms, NP  Multiple Vitamin (MULTIVITAMIN) tablet Take 1 tablet by mouth daily.    [provider]    Family History Family History  Problem Relation Age of Onset   Heart disease Father        MI   Heart disease Mother        pacemaker   Heart attack Maternal Uncle    Prostate cancer Paternal Uncle     Social History Social History   Tobacco Use   Smoking status: Never   Smokeless tobacco: Never  Vaping Use   Vaping status: Never Used  Substance Use Topics   Alcohol use: No   Drug  use: No     Allergies   Doxycycline   Review of Systems Review of Systems  Constitutional:  Positive for fever.  HENT:  Positive for congestion, ear pain, rhinorrhea, sinus pressure and sinus pain. Negative for sore throat.   Respiratory:  Positive for cough. Negative for shortness of breath and wheezing.   Neurological:  Positive for headaches.     Physical Exam Triage Vital Signs ED Triage Vitals  Encounter Vitals Group     BP 01/15/24 0839 (!) 144/80     Systolic BP Percentile --      Diastolic BP Percentile --      Pulse Rate 01/15/24 0839 87     Resp 01/15/24 0839  15     Temp 01/15/24 0839 98.4 F (36.9 C)     Temp Source 01/15/24 0839 Oral     SpO2 01/15/24 0839 94 %     Weight 01/15/24 0838 169 lb 5 oz (76.8 kg)     Height 01/15/24 0838 5\' 6"  (1.676 m)     Head Circumference --      Peak Flow --      Pain Score 01/15/24 0838 4     Pain Loc --      Pain Education --      Exclude from Growth Chart --    No data found.  Updated Vital Signs BP (!) 144/80 (BP Location: Left Arm)   Pulse 87   Temp 98.4 F (36.9 C) (Oral)   Resp 15   Ht 5\' 6"  (1.676 m)   Wt 169 lb 5 oz (76.8 kg)   SpO2 94%   BMI 27.33 kg/m   Visual Acuity Right Eye Distance:   Left Eye Distance:   Bilateral Distance:    Right Eye Near:   Left Eye Near:    Bilateral Near:     Physical Exam Vitals and nursing note reviewed.  Constitutional:      Appearance: Normal appearance. He is not ill-appearing.  HENT:     Head: Normocephalic and atraumatic.     Right Ear: Tympanic membrane, ear canal and external ear normal. There is no impacted cerumen.     Left Ear: Tympanic membrane, ear canal and external ear normal. There is no impacted cerumen.     Nose: Congestion and rhinorrhea present.     Comments: Nasal mucosa is erythematous and edematous with thick yellow discharge in both nares.  Bilateral frontal Sinuses are nontender to compression.    Mouth/Throat:     Mouth: Mucous membranes are moist.     Pharynx: Oropharynx is clear. No oropharyngeal exudate or posterior oropharyngeal erythema.  Cardiovascular:     Rate and Rhythm: Normal rate and regular rhythm.     Pulses: Normal pulses.     Heart sounds: Normal heart sounds. No murmur heard.    No friction rub. No gallop.  Pulmonary:     Effort: Pulmonary effort is normal.     Breath sounds: Normal breath sounds. No wheezing, rhonchi or rales.  Musculoskeletal:     Cervical back: Normal range of motion and neck supple. No tenderness.  Lymphadenopathy:     Cervical: No cervical adenopathy.  Skin:    General:  Skin is warm and dry.     Capillary Refill: Capillary refill takes less than 2 seconds.     Findings: No rash.  Neurological:     General: No focal deficit present.     Mental Status: He is alert and oriented  to person, place, and time.      UC Treatments / Results  Labs (all labs ordered are listed, but only abnormal results are displayed) Labs Reviewed - No data to display  EKG   Radiology No results found.  Procedures Procedures (including critical care time)  Medications Ordered in UC Medications - No data to display  Initial Impression / Assessment and Plan / UC Course  I have reviewed the triage vital signs and the nursing notes.  Pertinent labs & imaging results that were available during my care of the patient were reviewed by me and considered in my medical decision making (see chart for details).   Patient is a nontoxic-appearing 68 year old male presenting for evaluation of respiratory symptoms outlined HPI above.  His physical exam does reveal inflammation of his upper respiratory tract with erythematous and edematous nasal mucosa with thick yellow nasal discharge.  His sinuses are nontender to compression.  Cardiopulmonary dam is benign.  Patient exam is consistent with an upper respiratory infection.  Given that he has had symptoms for a week, and has had fevers, I do feel a trial of antibiotics is warranted.  I will discharge him home with a diagnosis of URI with cough and congestion on Augmentin 875 mg twice daily with food for 7 days.  Atrovent nasal spray help with his nasal congestion and postnasal drip.  Tessalon Perles and Promethazine DM cough syrup for cough and congestion.  Return precautions reviewed.   Final Clinical Impressions(s) / UC Diagnoses   Final diagnoses:  URI with cough and congestion     Discharge Instructions      Take the Augmentin 875 mg twice daily with food for 7 days for treatment of your upper respiratory infection.  Use  over-the-counter Tylenol and/or ibuprofen according the package instructions as needed for any fever or pain.  Use the Atrovent nasal spray, 2 squirts in each nostril every 6 hours, as needed for runny nose and postnasal drip.  Use the Tessalon Perles every 8 hours during the day.  Take them with a small sip of water.  They may give you some numbness to the base of your tongue or a metallic taste in your mouth, this is normal.  Use the Promethazine DM cough syrup at bedtime for cough and congestion.  It will make you drowsy so do not take it during the day.  Return for reevaluation or see your primary care provider for any new or worsening symptoms.      ED Prescriptions     Medication Sig Dispense Auth. Provider   amoxicillin-clavulanate (AUGMENTIN) 875-125 MG tablet Take 1 tablet by mouth every 12 (twelve) hours for 7 days. 14 tablet Becky Augusta, NP   benzonatate (TESSALON) 100 MG capsule Take 2 capsules (200 mg total) by mouth every 8 (eight) hours. 21 capsule Becky Augusta, NP   ipratropium (ATROVENT) 0.06 % nasal spray Place 2 sprays into both nostrils 4 (four) times daily. 15 mL Becky Augusta, NP   promethazine-dextromethorphan (PROMETHAZINE-DM) 6.25-15 MG/5ML syrup Take 5 mLs by mouth 4 (four) times daily as needed. 118 mL Becky Augusta, NP      PDMP not reviewed this encounter.   Becky Augusta, NP 01/15/24 [phone]

## 2024-01-21 ENCOUNTER — Ambulatory Visit: Payer: Medicare Other | Attending: Cardiology

## 2024-01-21 DIAGNOSIS — I77810 Thoracic aortic ectasia: Secondary | ICD-10-CM

## 2024-01-21 LAB — ECHOCARDIOGRAM COMPLETE
AR max vel: 3.82 cm2
AV Area VTI: 3.91 cm2
AV Area mean vel: 3.75 cm2
AV Mean grad: 3 mm[Hg]
AV Peak grad: 5.4 mm[Hg]
Ao pk vel: 1.16 m/s
Area-P 1/2: 3.91 cm2
Calc EF: 56.3 %
S' Lateral: 2.9 cm
Single Plane A2C EF: 57.6 %
Single Plane A4C EF: 54.7 %

## 2024-01-22 NOTE — Progress Notes (Unsigned)
   Cardiology Clinic Note   Date: 01/24/2024 ID: Benjamin Boone, DOB 03-May-1956, MRN [id number]  Primary Cardiologist:  Debbe Odea, MD  Chief Complaint   Benjamin Boone is a 68 y.o. male who presents to the clinic today for follow up after echo.   Patient Profile   Benjamin Boone is followed by Dr. Azucena Cecil for the history outlined below.      Past medical history significant for: Aortic root dilatation. Echo 01/21/2024: EF 55 to 60%.  No RWMA.  Normal diastolic parameters.  Normal RV size/function.  Mild MR.  Mild dilatation of aortic root 39 mm. Venous insufficiency. Hyperlipidemia. Lipid panel 10/20/2023: LDL 123, HDL 36, TG 196, total 194. Asthma. Psoriatic arthritis.  In summary, patient was initially followed by Dr. Alvino Chapel in February 2018 for sudden onset of diaphoresis and left arm pain that woke him from sleep.  Echo March 2018 showed EF 55 to 65%, no RWMA, normal diastolic parameters, mild MR, normal PA pressure, mildly dilated aortic root 3.9 cm.  Nuclear stress test March 2018 was a normal, low risk study.  He was seen in follow-up by Dr. Mariah Milling in April 2018 and she was doing well at that time and no further episodes.  He was instructed to follow-up as needed.  CT cardiac scoring in August 2018 showed a calcium score of 0.  Patient was evaluated by Dr. Azucena Cecil on 01/04/2024 to reestablish cardiac care.     History of Present Illness    Today, patient is accompanied by his wife. Patient denies shortness of breath, dyspnea on exertion, lower extremity edema, orthopnea or PND. No chest pain, pressure, or tightness. No palpitations.  He is active working Holiday representative.     ROS: All other systems reviewed and are otherwise negative except as noted in History of Present Illness.  EKGs/Labs Reviewed       EKG is not ordered today.   10/20/2023: ALT 23; AST 25; BUN 16; Creatinine, Ser 1.24; Potassium 4.2; Sodium 141   10/20/2023:  Hemoglobin 15.1; WBC 6.0   10/20/2023: TSH 2.520   Physical Exam    VS:  BP 110/78 (BP Location: Left Arm)   Pulse 84   Ht 5\' 6"  (1.676 m)   Wt 169 lb 12.8 oz (77 kg)   SpO2 97%   BMI 27.41 kg/m  , BMI Body mass index is 27.41 kg/m.  GEN: Well nourished, well developed, in no acute distress. Neck: No JVD or carotid bruits. Cardiac:  RRR. No murmurs. No rubs or gallops.   Respiratory:  Respirations regular and unlabored. Clear to auscultation without rales, wheezing or rhonchi. GI: Soft, nontender, nondistended. Extremities: Radials/DP/PT 2+ and equal bilaterally. No clubbing or cyanosis. No edema.  Skin: Warm and dry, no rash. Neuro: Strength intact.  Assessment & Plan    Aortic root dilatation Echo February 2025 showed EF 55 to 60%, no RWMA, normal diastolic parameters, normal RV size/function, mild MR, mild dilatation of aortic root 39 mm (stable from previous echo March 2018).  Patient denies chest pain, shortness of breath or DOE.  -Repeat echo 1 year.  Hyperlipidemia LDL November 2024 123, not at goal.  Lipitor was started at last visit February 2025. -Continue  atorvastatin.  Disposition: Repeat echo in 1 year. Return in 1 year (after echo) or sooner as needed.          Signed, Etta Grandchild. Torrie Lafavor, DNP, NP-C

## 2024-01-24 ENCOUNTER — Encounter: Payer: Self-pay | Admitting: Student

## 2024-01-24 ENCOUNTER — Ambulatory Visit: Payer: Medicare Other | Attending: Student | Admitting: Student

## 2024-01-24 VITALS — BP 110/78 | HR 84 | Ht 66.0 in | Wt 169.8 lb

## 2024-01-24 DIAGNOSIS — I77810 Thoracic aortic ectasia: Secondary | ICD-10-CM | POA: Diagnosis not present

## 2024-01-24 DIAGNOSIS — E782 Mixed hyperlipidemia: Secondary | ICD-10-CM

## 2024-01-24 NOTE — Addendum Note (Signed)
 Addended by: Ursula Alert on: 01/24/2024 02:49 PM   Modules accepted: Orders

## 2024-01-24 NOTE — Patient Instructions (Signed)
 Medication Instructions:  Your Physician recommend you continue on your current medication as directed.    *If you need a refill on your cardiac medications before your next appointment, please call your pharmacy*   Lab Work: None ordered at this time    Testing/Procedures: Your physician has requested that you have an echocardiogram in one year. Echocardiography is a painless test that uses sound waves to create images of your heart. It provides your doctor with information about the size and shape of your heart and how well your heart's chambers and valves are working.   You may receive an ultrasound enhancing agent through an IV if needed to better visualize your heart during the echo. This procedure takes approximately one hour.  There are no restrictions for this procedure.  This will take place at [address] [facility] [address] [facility] [address], [place] [address]  Please note: We ask at that you not bring children with you during ultrasound (echo/ vascular) testing. Due to room size and safety concerns, children are not allowed in the ultrasound rooms during exams. Our front office staff cannot provide observation of children in our lobby area while testing is being conducted. An adult accompanying a patient to their appointment will only be allowed in the ultrasound room at the discretion of the ultrasound technician under special circumstances. We apologize for any inconvenience.   Follow-Up: At [facility], you and your health needs are our priority.  As part of our continuing mission to provide you with exceptional heart care, we have created designated Provider Care Teams.  These Care Teams include your primary Cardiologist (physician) and Advanced Practice Providers (APPs -  Physician Assistants and Nurse Practitioners) who all work together to provide you with the care you need, when you need it.  We recommend signing up for the patient portal called "MyChart".  Sign up  information is provided on this After Visit Summary.  MyChart is used to connect with patients for Virtual Visits (Telemedicine).  Patients are able to view lab/test results, encounter notes, upcoming appointments, etc.  Non-urgent messages can be sent to your provider as well.   To learn more about what you can do with MyChart, go to [email or web address].    Your next appointment:   1 year(s)  Provider:   You may see Debbe Odea, MD or Carlos Levering, NP

## 2024-01-25 ENCOUNTER — Encounter: Payer: Self-pay | Admitting: Emergency Medicine

## 2024-03-15 DIAGNOSIS — K080 Exfoliation of teeth due to systemic causes: Secondary | ICD-10-CM | POA: Diagnosis not present

## 2024-04-19 ENCOUNTER — Ambulatory Visit: Payer: Self-pay | Admitting: Nurse Practitioner

## 2024-05-15 ENCOUNTER — Ambulatory Visit: Admitting: Nurse Practitioner

## 2024-05-22 DIAGNOSIS — M3501 Sicca syndrome with keratoconjunctivitis: Secondary | ICD-10-CM | POA: Diagnosis not present

## 2024-05-22 DIAGNOSIS — H25013 Cortical age-related cataract, bilateral: Secondary | ICD-10-CM | POA: Diagnosis not present

## 2024-05-22 DIAGNOSIS — H2513 Age-related nuclear cataract, bilateral: Secondary | ICD-10-CM | POA: Diagnosis not present

## 2024-05-23 ENCOUNTER — Ambulatory Visit: Admitting: Nurse Practitioner

## 2024-05-23 NOTE — Progress Notes (Deleted)
 There were no vitals taken for this visit.   Subjective:    Patient ID: Zaryan Yakubov, male    DOB: 06-30-1956, 68 y.o.   MRN: [id number]  HPI: Bobbye Reinitz is a 68 y.o. male  No chief complaint on file.  HYPERLIPIDEMIA Hyperlipidemia status: excellent compliance Satisfied with current treatment?  yes Side effects:  no Medication compliance: excellent compliance Past cholesterol meds: gemfibrozil  Supplements: none Aspirin:  no The 10-year ASCVD risk score (Arnett DK, et al., 2019) is: 11.7%   Values used to calculate the score:     Age: 89 years     Clincally relevant sex: Male     Is Non-Hispanic African American: No     Diabetic: No     Tobacco smoker: No     Systolic Blood Pressure: 110 mmHg     Is BP treated: No     HDL Cholesterol: 36 mg/dL     Total Cholesterol: 194 mg/dL Chest pain:  no Coronary artery disease:  no Family history CAD:  no Family history early CAD:  no   Denies HA, CP, SOB, dizziness, palpitations, visual changes, and lower extremity swelling.  PSORIATIC ARTHRITIS Feels like he is doing well.  Does not feel like he needs to see specialist regarding psoriatic arthritis at this time.    Relevant past medical, surgical, family and social history reviewed and updated as indicated. Interim medical history since our last visit reviewed. Allergies and medications reviewed and updated.  Review of Systems  Eyes:  Negative for visual disturbance.  Respiratory:  Negative for shortness of breath.   Cardiovascular:  Negative for chest pain and leg swelling.  Neurological:  Negative for light-headedness and headaches.    Per HPI unless specifically indicated above     Objective:    There were no vitals taken for this visit.  Wt Readings from Last 3 Encounters:  01/24/24 169 lb 12.8 oz (77 kg)  01/15/24 169 lb 5 oz (76.8 kg)  01/04/24 169 lb 6.4 oz (76.8 kg)    Physical Exam Vitals and nursing note reviewed.   Constitutional:      General: He is not in acute distress.    Appearance: Normal appearance. He is not ill-appearing, toxic-appearing or diaphoretic.  HENT:     Head: Normocephalic.     Right Ear: External ear normal.     Left Ear: External ear normal.     Nose: Nose normal. No congestion or rhinorrhea.     Mouth/Throat:     Mouth: Mucous membranes are moist.   Eyes:     General:        Right eye: No discharge.        Left eye: No discharge.     Extraocular Movements: Extraocular movements intact.     Conjunctiva/sclera: Conjunctivae normal.     Pupils: Pupils are equal, round, and reactive to light.    Cardiovascular:     Rate and Rhythm: Normal rate and regular rhythm.     Heart sounds: No murmur heard. Pulmonary:     Effort: Pulmonary effort is normal. No respiratory distress.     Breath sounds: Normal breath sounds. No wheezing, rhonchi or rales.  Abdominal:     General: Abdomen is flat. Bowel sounds are normal.   Musculoskeletal:     Cervical back: Normal range of motion and neck supple.   Skin:    General: Skin is warm and dry.     Capillary Refill: Capillary refill  takes less than 2 seconds.   Neurological:     General: No focal deficit present.     Mental Status: He is alert and oriented to person, place, and time.   Psychiatric:        Mood and Affect: Mood normal.        Behavior: Behavior normal.        Thought Content: Thought content normal.        Judgment: Judgment normal.     Results for orders placed or performed in visit on 01/21/24  ECHOCARDIOGRAM COMPLETE   Collection Time: 01/21/24  2:19 PM  Result Value Ref Range   AR max vel 3.82 cm2   AV Peak grad 5.4 mmHg   Ao pk vel 1.16 m/s   S' Lateral 2.90 cm   Area-P 1/2 3.91 cm2   AV Area VTI 3.91 cm2   AV Mean grad 3.0 mmHg   Single Plane A4C EF 54.7 %   Single Plane A2C EF 57.6 %   Calc EF 56.3 %   AV Area mean vel 3.75 cm2   Est EF 55 - 60%       Assessment & Plan:   Problem List  Items Addressed This Visit   None     Follow up plan: No follow-ups on file.

## 2024-05-29 DIAGNOSIS — J330 Polyp of nasal cavity: Secondary | ICD-10-CM | POA: Diagnosis not present

## 2024-05-29 DIAGNOSIS — J301 Allergic rhinitis due to pollen: Secondary | ICD-10-CM | POA: Diagnosis not present

## 2024-06-14 ENCOUNTER — Encounter: Payer: Self-pay | Admitting: Nurse Practitioner

## 2024-06-14 ENCOUNTER — Ambulatory Visit (INDEPENDENT_AMBULATORY_CARE_PROVIDER_SITE_OTHER): Admitting: Nurse Practitioner

## 2024-06-14 VITALS — BP 116/78 | HR 71 | Temp 98.0°F | Ht 66.0 in | Wt 166.8 lb

## 2024-06-14 DIAGNOSIS — E782 Mixed hyperlipidemia: Secondary | ICD-10-CM

## 2024-06-14 DIAGNOSIS — L4050 Arthropathic psoriasis, unspecified: Secondary | ICD-10-CM

## 2024-06-14 DIAGNOSIS — I77810 Thoracic aortic ectasia: Secondary | ICD-10-CM | POA: Diagnosis not present

## 2024-06-14 NOTE — Assessment & Plan Note (Signed)
 Followed by Cardiology Annually.  On Atorvastatin  daily.  Continue with medication.  Note reviewed.  Will have ECHO done at next visit.

## 2024-06-14 NOTE — Assessment & Plan Note (Signed)
 Chronic.  Controlled.  Continue with current medication regimen of Gemfibrozil  and atorvastatin .  Refills sent today.  Labs ordered today.  Return to clinic in 6 months for reevaluation.  Call sooner if concerns arise.

## 2024-06-14 NOTE — Assessment & Plan Note (Signed)
 Chronic.  Controlled without medication..  Labs ordered today.  Return to clinic in 6 months for reevaluation.  Call sooner if concerns arise.

## 2024-06-14 NOTE — Progress Notes (Signed)
 BP 116/78   Pulse 71   Temp 98 F (36.7 C) (Oral)   Ht 5' 6 (1.676 m)   Wt 166 lb 12.8 oz (75.7 kg)   SpO2 97%   BMI 26.92 kg/m    Subjective:    Patient ID: Benjamin Boone, male    DOB: 08-06-1956, 68 y.o.   MRN: [id number]  HPI: Benjamin Boone is a 68 y.o. male  Chief Complaint  Patient presents with   Hyperlipidemia   HYPERLIPIDEMIA Hyperlipidemia status: excellent compliance Satisfied with current treatment?  yes Side effects:  no Medication compliance: excellent compliance Past cholesterol meds: gemfibrozil  and atorvastatin   Supplements: none Aspirin:  no The 10-year ASCVD risk score (Arnett DK, et al., 2019) is: 13.6%   Values used to calculate the score:     Age: 74 years     Clincally relevant sex: Male     Is Non-Hispanic African American: No     Diabetic: No     Tobacco smoker: No     Systolic Blood Pressure: 116 mmHg     Is BP treated: No     HDL Cholesterol: 36 mg/dL     Total Cholesterol: 194 mg/dL Chest pain:  no Coronary artery disease:  no Family history CAD:  no Family history early CAD:  no   Denies HA, CP, SOB, dizziness, palpitations, visual changes, and lower extremity swelling.  PSORIATIC ARTHRITIS Feels like he is doing well.  Does not feel like he needs to see specialist regarding psoriatic arthritis at this time.    Relevant past medical, surgical, family and social history reviewed and updated as indicated. Interim medical history since our last visit reviewed. Allergies and medications reviewed and updated.  Review of Systems  Eyes:  Negative for visual disturbance.  Respiratory:  Negative for shortness of breath.   Cardiovascular:  Negative for chest pain and leg swelling.  Neurological:  Negative for light-headedness and headaches.    Per HPI unless specifically indicated above     Objective:    BP 116/78   Pulse 71   Temp 98 F (36.7 C) (Oral)   Ht 5' 6 (1.676 m)   Wt 166 lb 12.8 oz (75.7 kg)    SpO2 97%   BMI 26.92 kg/m   Wt Readings from Last 3 Encounters:  06/14/24 166 lb 12.8 oz (75.7 kg)  01/24/24 169 lb 12.8 oz (77 kg)  01/15/24 169 lb 5 oz (76.8 kg)    Physical Exam Vitals and nursing note reviewed.  Constitutional:      General: He is not in acute distress.    Appearance: Normal appearance. He is not ill-appearing, toxic-appearing or diaphoretic.  HENT:     Head: Normocephalic.     Right Ear: External ear normal.     Left Ear: External ear normal.     Nose: Nose normal. No congestion or rhinorrhea.     Mouth/Throat:     Mouth: Mucous membranes are moist.  Eyes:     General:        Right eye: No discharge.        Left eye: No discharge.     Extraocular Movements: Extraocular movements intact.     Conjunctiva/sclera: Conjunctivae normal.     Pupils: Pupils are equal, round, and reactive to light.  Cardiovascular:     Rate and Rhythm: Normal rate and regular rhythm.     Heart sounds: No murmur heard. Pulmonary:     Effort: Pulmonary effort  is normal. No respiratory distress.     Breath sounds: Normal breath sounds. No wheezing, rhonchi or rales.  Abdominal:     General: Abdomen is flat. Bowel sounds are normal.  Musculoskeletal:     Cervical back: Normal range of motion and neck supple.  Skin:    General: Skin is warm and dry.     Capillary Refill: Capillary refill takes less than 2 seconds.  Neurological:     General: No focal deficit present.     Mental Status: He is alert and oriented to person, place, and time.  Psychiatric:        Mood and Affect: Mood normal.        Behavior: Behavior normal.        Thought Content: Thought content normal.        Judgment: Judgment normal.     Results for orders placed or performed in visit on 01/21/24  ECHOCARDIOGRAM COMPLETE   Collection Time: 01/21/24  2:19 PM  Result Value Ref Range   AR max vel 3.82 cm2   AV Peak grad 5.4 mmHg   Ao pk vel 1.16 m/s   S' Lateral 2.90 cm   Area-P 1/2 3.91 cm2   AV Area  VTI 3.91 cm2   AV Mean grad 3.0 mmHg   Single Plane A4C EF 54.7 %   Single Plane A2C EF 57.6 %   Calc EF 56.3 %   AV Area mean vel 3.75 cm2   Est EF 55 - 60%       Assessment & Plan:   Problem List Items Addressed This Visit       Cardiovascular and Mediastinum   Dilated aortic root (HCC) - Primary   Followed by Cardiology Annually.  On Atorvastatin  daily.  Continue with medication.  Note reviewed.  Will have ECHO done at next visit.       Relevant Medications   gemfibrozil  (LOPID ) 600 MG tablet   Other Relevant Orders   Comprehensive metabolic panel with GFR     Musculoskeletal and Integument   Psoriatic arthritis (HCC)   Chronic.  Controlled without medication. Labs ordered today.  Return to clinic in 6 months for reevaluation.  Call sooner if concerns arise.       Relevant Orders   CBC With Diff/Platelet     Other   Hyperlipidemia   Chronic.  Controlled.  Continue with current medication regimen of Gemfibrozil  and atorvastatin .  Refills sent today.  Labs ordered today.  Return to clinic in 6 months for reevaluation.  Call sooner if concerns arise.       Relevant Medications   gemfibrozil  (LOPID ) 600 MG tablet   Other Relevant Orders   Lipid panel      Follow up plan: No follow-ups on file.

## 2024-06-15 ENCOUNTER — Ambulatory Visit: Payer: Self-pay | Admitting: Nurse Practitioner

## 2024-06-15 LAB — CBC WITH DIFF/PLATELET
Basophils Absolute: 0.1 x10E3/uL (ref 0.0–0.2)
Basos: 1 %
EOS (ABSOLUTE): 0.3 x10E3/uL (ref 0.0–0.4)
Eos: 4 %
Hematocrit: 44.5 % (ref 37.5–51.0)
Hemoglobin: 15.2 g/dL (ref 13.0–17.7)
Immature Grans (Abs): 0.1 x10E3/uL (ref 0.0–0.1)
Immature Granulocytes: 1 %
Lymphocytes Absolute: 2.1 x10E3/uL (ref 0.7–3.1)
Lymphs: 28 %
MCH: 29.8 pg (ref 26.6–33.0)
MCHC: 34.2 g/dL (ref 31.5–35.7)
MCV: 87 fL (ref 79–97)
Monocytes Absolute: 0.9 x10E3/uL (ref 0.1–0.9)
Monocytes: 12 %
Neutrophils Absolute: 4.1 x10E3/uL (ref 1.4–7.0)
Neutrophils: 54 %
Platelets: 273 x10E3/uL (ref 150–450)
RBC: 5.1 x10E6/uL (ref 4.14–5.80)
RDW: 14.3 % (ref 11.6–15.4)
WBC: 7.7 x10E3/uL (ref 3.4–10.8)

## 2024-06-15 LAB — LIPID PANEL
Chol/HDL Ratio: 5.8 ratio — ABNORMAL HIGH (ref 0.0–5.0)
Cholesterol, Total: 231 mg/dL — ABNORMAL HIGH (ref 100–199)
HDL: 40 mg/dL (ref >39)
LDL Chol Calc (NIH): 122 mg/dL — ABNORMAL HIGH (ref 0–99)
Triglycerides: 391 mg/dL — ABNORMAL HIGH (ref 0–149)
VLDL Cholesterol Cal: 69 mg/dL — ABNORMAL HIGH (ref 5–40)

## 2024-06-15 LAB — COMPREHENSIVE METABOLIC PANEL WITH GFR
ALT: 26 IU/L (ref 0–44)
AST: 24 IU/L (ref 0–40)
Albumin: 4.4 g/dL (ref 3.9–4.9)
Alkaline Phosphatase: 59 IU/L (ref 44–121)
BUN/Creatinine Ratio: 13 (ref 10–24)
BUN: 15 mg/dL (ref 8–27)
Bilirubin Total: 0.4 mg/dL (ref 0.0–1.2)
CO2: 20 mmol/L (ref 20–29)
Calcium: 9.7 mg/dL (ref 8.6–10.2)
Chloride: 100 mmol/L (ref 96–106)
Creatinine, Ser: 1.16 mg/dL (ref 0.76–1.27)
Globulin, Total: 2.3 g/dL (ref 1.5–4.5)
Glucose: 83 mg/dL (ref 70–99)
Potassium: 4.3 mmol/L (ref 3.5–5.2)
Sodium: 138 mmol/L (ref 134–144)
Total Protein: 6.7 g/dL (ref 6.0–8.5)
eGFR: 69 mL/min/1.73 (ref >59)

## 2024-08-19 ENCOUNTER — Other Ambulatory Visit: Payer: Self-pay | Admitting: Nurse Practitioner

## 2024-08-22 NOTE — Telephone Encounter (Signed)
 From last visit it was 6 month follow up but these are medications we have not prescribed so would need a visit before we can fill.

## 2024-08-22 NOTE — Telephone Encounter (Signed)
 Requested medication (s) are due for refill today: yes  Requested medication (s) are on the active medication list: no  Last refill:  06/14/24  Future visit scheduled: yes  Notes to clinic:  historical medication, not on current list.     Requested Prescriptions  Pending Prescriptions Disp Refills   gemfibrozil  (LOPID ) 600 MG tablet [Pharmacy Med Name: GEMFIBROZIL  600MG  TABLETS] 180 tablet     Sig: TAKE 1 TABLET BY MOUTH TWICE DAILY BEFORE A MEAL     Cardiovascular:  Antilipid - Fibric Acid Derivatives Failed - 08/22/2024  9:42 AM      Failed - Lipid Panel in normal range within the last 12 months    Cholesterol, Total  Date Value Ref Range Status  06/14/2024 231 (H) 100 - 199 mg/dL Final   LDL Chol Calc (NIH)  Date Value Ref Range Status  06/14/2024 122 (H) 0 - 99 mg/dL Final   HDL  Date Value Ref Range Status  06/14/2024 40 >39 mg/dL Final   Triglycerides  Date Value Ref Range Status  06/14/2024 391 (H) 0 - 149 mg/dL Final         Passed - ALT in normal range and within 360 days    ALT  Date Value Ref Range Status  06/14/2024 26 0 - 44 IU/L Final         Passed - AST in normal range and within 360 days    AST  Date Value Ref Range Status  06/14/2024 24 0 - 40 IU/L Final         Passed - Cr in normal range and within 360 days    Creatinine, Ser  Date Value Ref Range Status  06/14/2024 1.16 0.76 - 1.27 mg/dL Final         Passed - HGB in normal range and within 360 days    Hemoglobin  Date Value Ref Range Status  06/14/2024 15.2 13.0 - 17.7 g/dL Final         Passed - HCT in normal range and within 360 days    Hematocrit  Date Value Ref Range Status  06/14/2024 44.5 37.5 - 51.0 % Final         Passed - PLT in normal range and within 360 days    Platelets  Date Value Ref Range Status  06/14/2024 273 150 - 450 x10E3/uL Final         Passed - WBC in normal range and within 360 days    WBC  Date Value Ref Range Status  06/14/2024 7.7 3.4 - 10.8  x10E3/uL Final         Passed - eGFR is 30 or above and within 360 days    GFR calc Af Amer  Date Value Ref Range Status  10/07/2020 103 >59 mL/min/1.73 Final    Comment:    **In accordance with recommendations from the NKF-ASN Task force,**   [facility] is in the process of updating its eGFR calculation to the   2021 CKD-EPI creatinine equation that estimates kidney function   without a race variable.    GFR calc non Af Amer  Date Value Ref Range Status  10/07/2020 89 >59 mL/min/1.73 Final   eGFR  Date Value Ref Range Status  06/14/2024 69 >59 mL/min/1.73 Final         Passed - Valid encounter within last 12 months    Recent Outpatient Visits           2 months ago Dilated aortic  root   [facility] Melvin Pao, NP

## 2024-08-31 ENCOUNTER — Ambulatory Visit (INDEPENDENT_AMBULATORY_CARE_PROVIDER_SITE_OTHER): Admitting: Pediatrics

## 2024-08-31 ENCOUNTER — Encounter: Payer: Self-pay | Admitting: Pediatrics

## 2024-08-31 VITALS — BP 127/86 | HR 75 | Temp 97.8°F

## 2024-08-31 DIAGNOSIS — B9689 Other specified bacterial agents as the cause of diseases classified elsewhere: Secondary | ICD-10-CM

## 2024-08-31 DIAGNOSIS — J019 Acute sinusitis, unspecified: Secondary | ICD-10-CM | POA: Diagnosis not present

## 2024-08-31 MED ORDER — AMOXICILLIN-POT CLAVULANATE 875-125 MG PO TABS: 1.0000 | ORAL_TABLET | Freq: Two times a day (BID) | ORAL | 0 refills | Status: DC

## 2024-08-31 NOTE — Patient Instructions (Signed)
Augmentin for 7 days

## 2024-08-31 NOTE — Progress Notes (Signed)
 Office Visit  BP 127/86   Pulse 75   Temp 97.8 F (36.6 C) (Oral)   SpO2 97%    Subjective:    Patient ID: Benjamin Boone, male    DOB: December 04, 1955, 68 y.o.   MRN: [id number]  HPI: Benjamin Boone is a 68 y.o. male  Chief Complaint  Patient presents with   Cough    And drainage ongoing for the past 5 days or so has took mucinex it helps but can't urinate when he takes it     Discussed the use of AI scribe software for clinical note transcription with the patient, who gave verbal consent to proceed.  History of Present Illness   Benjamin Boone is a 67 year old male with a history of sinus infections and nasal polyps who presents with symptoms suggestive of a sinus infection.  He has been experiencing symptoms for the past five days, which he believes he contracted from his wife. The symptoms are similar to previous sinus infections he has had. He has a cough but is unsure about having a fever. No significant body aches beyond his normal level are present.  He has a history of recurrent sinus infections and has previously been treated with Augmentin , which sometimes required a second round due to insufficient duration.  He is currently taking Nasacort, prescribed by his ENT for nasal polyps, with one spray in each nostril daily. He took Mucinex last night, which makes it difficult to urinate, so he tries to avoid it. He has not used sinus rinses recently but has the equipment to do so.  He experiences significant drainage, which he finds most bothersome, and has had a mild earache. He has Tessalon  Perles available for cough, which he has used in the past.        Relevant past medical, surgical, family and social history reviewed and updated as indicated. Interim medical history since our last visit reviewed. Allergies and medications reviewed and updated.  ROS per HPI unless specifically indicated above     Objective:    BP 127/86   Pulse 75    Temp 97.8 F (36.6 C) (Oral)   SpO2 97%   Wt Readings from Last 3 Encounters:  06/14/24 166 lb 12.8 oz (75.7 kg)  01/24/24 169 lb 12.8 oz (77 kg)  01/15/24 169 lb 5 oz (76.8 kg)     Physical Exam Constitutional:      Appearance: Normal appearance. He is ill-appearing.  HENT:     Right Ear: Tympanic membrane and ear canal normal.     Left Ear: Tympanic membrane and ear canal normal.     Nose: Congestion present.     Right Turbinates: Enlarged and swollen.     Left Turbinates: Enlarged and swollen.     Right Sinus: Frontal sinus tenderness present. No maxillary sinus tenderness.     Left Sinus: Frontal sinus tenderness present. No maxillary sinus tenderness.  Eyes:     Pupils: Pupils are equal, round, and reactive to light.  Pulmonary:     Effort: Pulmonary effort is normal.  Musculoskeletal:        General: Normal range of motion.  Skin:    Comments: Normal skin color  Neurological:     General: No focal deficit present.     Mental Status: He is alert. Mental status is at baseline.  Psychiatric:        Mood and Affect: Mood normal.  Behavior: Behavior normal.        Thought Content: Thought content normal.         08/31/2024    9:25 AM 06/14/2024    1:56 PM 10/01/2023    8:44 AM 04/14/2023    9:24 AM 10/09/2022    8:06 AM  Depression screen PHQ 2/9  Decreased Interest 0 0 0 0 0  Down, Depressed, Hopeless 0 0 0 0 0  PHQ - 2 Score 0 0 0 0 0  Altered sleeping 0 0 1 1 0  Tired, decreased energy 0 0 1 0 0  Change in appetite 0 1 1 0 0  Feeling bad or failure about yourself  0 0 0 0 0  Trouble concentrating 0 0 0 0 0  Moving slowly or fidgety/restless 0 0 0 0 0  Suicidal thoughts 0 0 0 0 0  PHQ-9 Score 0 1 3 1  0  Difficult doing work/chores Not difficult at all Not difficult at all Somewhat difficult Not difficult at all Not difficult at all       08/31/2024    9:25 AM 06/14/2024    1:56 PM 10/01/2023    8:44 AM 04/14/2023    9:24 AM  GAD 7 : Generalized  Anxiety Score  Nervous, Anxious, on Edge 0 0 0 0  Control/stop worrying 0 0 1 0  Worry too much - different things 0 0 1 0  Trouble relaxing 0 0 1 1  Restless 0 0 0 0  Easily annoyed or irritable 0 0 0 0  Afraid - awful might happen 0 0 1 0  Total GAD 7 Score 0 0 4 1  Anxiety Difficulty Not difficult at all Not difficult at all Somewhat difficult Not difficult at all       Assessment & Plan:  Assessment & Plan   Acute bacterial sinusitis Acute sinusitis likely due to recurrent nasal congestion and drainage. Nasal polyps present per patient, managed with Nasacort. Ear pressure secondary to sinus congestion. Differential includes flu and COVID, but testing not pursued given outside treatment window. Since worsening sx after 5 days, plan to treat w abx as previously. - Prescribe Augmentin  for 7 days. - Advise use of Afrin spray for no more than 3 days. - Continue Nasacort as prescribed by ENT. - Recommend sinus rinses. - Advise use of Tessalon  Perles as needed for cough. - Instruct to contact provider if additional Tessalon  Perles are needed.  - Suggest honey for antibacterial and anti-inflammatory properties. - Consider ENT follow-up if recurrent infections occur. -     Amoxicillin -Pot Clavulanate; Take 1 tablet by mouth 2 (two) times daily for 7 days.  Dispense: 14 tablet; Refill: 0  Follow up plan: Return if symptoms worsen or fail to improve.  Hadassah SHAUNNA Nett, MD

## 2024-09-06 ENCOUNTER — Telehealth: Payer: Self-pay | Admitting: Pediatrics

## 2024-09-06 NOTE — Telephone Encounter (Signed)
 Please see note below. Patient did not answer upon RN attempting to return call to triage.   Copied from CRM #[id number]. Topic: Clinical - Medication Question >> Sep 06, 2024  1:47 PM Donee H wrote: Reason for CRM: Patient calling regarding antibiotics  amoxicillin -clavulanate (AUGMENTIN ) 875-125 MG tablet. Patient states he has only 2 tablets left. He states he is feeling better but still not a 100% over sinus infection. Patient is wanting to know if there can be a refill for at least 2 or 3 more days so he can get rid of infection.  Patient would like for it to be sent to  [id number], [place] - [address] AT [facility] [address] & [address], [place] [address] Phone: [phone]  Fax: [phone]

## 2024-09-06 NOTE — Telephone Encounter (Signed)
 Copied from CRM #[id number]. Topic: Clinical - Medication Question >> Sep 06, 2024  1:47 PM Donee H wrote: Reason for CRM: Patient calling regarding antibiotics  amoxicillin -clavulanate (AUGMENTIN ) 875-125 MG tablet. Patient states he has only 2 tablets left. He states he is feeling better but still not a 100% over sinus infection. Patient is wanting to know if there can be a refill for at least 2 or 3 more days so he can get rid of infection.  Patient would like for it to be sent to  [facility] #[id number] [facility], [place] - [address] AT [facility] [address] & [address], [place] [address] Phone: [phone]  Fax: [phone]

## 2024-09-07 ENCOUNTER — Other Ambulatory Visit: Payer: Self-pay | Admitting: Pediatrics

## 2024-09-07 DIAGNOSIS — B9689 Other specified bacterial agents as the cause of diseases classified elsewhere: Secondary | ICD-10-CM

## 2024-09-07 MED ORDER — AMOXICILLIN-POT CLAVULANATE 875-125 MG PO TABS: 1.0000 | ORAL_TABLET | Freq: Two times a day (BID) | ORAL | 0 refills | Status: AC

## 2024-09-07 NOTE — Telephone Encounter (Signed)
 Routing to provider who saw the patient.

## 2024-09-07 NOTE — Telephone Encounter (Signed)
Called and notified patient that medication has been sent in for him.

## 2024-09-07 NOTE — Progress Notes (Signed)
 Sent 3 more days for total 10 day course for augmentin  for sinus infection.  Benjamin SHAUNNA Nett, MD

## 2024-11-01 DIAGNOSIS — K080 Exfoliation of teeth due to systemic causes: Secondary | ICD-10-CM | POA: Diagnosis not present

## 2025-01-02 ENCOUNTER — Ambulatory Visit

## 2025-01-23 ENCOUNTER — Other Ambulatory Visit
